# Patient Record
Sex: Female | Born: 1960 | Race: White | Hispanic: No | Smoking: Former smoker
Health system: Southern US, Community
[De-identification: ages and names within clinical notes are randomized; demographics above are authoritative.]

## PROBLEM LIST (undated history)

## (undated) DIAGNOSIS — F419 Anxiety disorder, unspecified: Secondary | ICD-10-CM

## (undated) DIAGNOSIS — F32A Depression, unspecified: Secondary | ICD-10-CM

## (undated) DIAGNOSIS — E039 Hypothyroidism, unspecified: Secondary | ICD-10-CM

## (undated) DIAGNOSIS — E7800 Pure hypercholesterolemia, unspecified: Secondary | ICD-10-CM

## (undated) DIAGNOSIS — M1990 Unspecified osteoarthritis, unspecified site: Secondary | ICD-10-CM

## (undated) DIAGNOSIS — E119 Type 2 diabetes mellitus without complications: Secondary | ICD-10-CM

## (undated) DIAGNOSIS — I10 Essential (primary) hypertension: Secondary | ICD-10-CM

## (undated) DIAGNOSIS — T7840XA Allergy, unspecified, initial encounter: Secondary | ICD-10-CM

## (undated) DIAGNOSIS — F329 Major depressive disorder, single episode, unspecified: Secondary | ICD-10-CM

## (undated) HISTORY — PX: BREAST BIOPSY: SHX20

## (undated) HISTORY — DX: Unspecified osteoarthritis, unspecified site: M19.90

## (undated) HISTORY — PX: OTHER SURGICAL HISTORY: SHX169

## (undated) HISTORY — DX: Allergy, unspecified, initial encounter: T78.40XA

---
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
## 2000-03-24 HISTORY — PX: THYROID LOBECTOMY: SHX420

## 2000-10-21 ENCOUNTER — Other Ambulatory Visit: Admission: RE | Admit: 2000-10-21 | Discharge: 2000-10-21 | Payer: Self-pay | Admitting: Internal Medicine

## 2004-04-30 ENCOUNTER — Ambulatory Visit: Payer: Self-pay

## 2005-07-27 ENCOUNTER — Ambulatory Visit: Payer: Self-pay

## 2006-07-28 ENCOUNTER — Ambulatory Visit: Payer: Self-pay | Admitting: General Surgery

## 2007-03-28 ENCOUNTER — Ambulatory Visit: Payer: Self-pay

## 2007-04-05 ENCOUNTER — Ambulatory Visit: Payer: Self-pay

## 2007-08-16 ENCOUNTER — Ambulatory Visit: Payer: Self-pay | Admitting: Family Medicine

## 2007-10-25 ENCOUNTER — Ambulatory Visit: Payer: Self-pay | Admitting: Gastroenterology

## 2007-10-31 DIAGNOSIS — K52831 Collagenous colitis: Secondary | ICD-10-CM | POA: Insufficient documentation

## 2008-05-24 HISTORY — PX: HAND SURGERY: SHX662

## 2008-08-20 ENCOUNTER — Ambulatory Visit: Payer: Self-pay | Admitting: Family Medicine

## 2009-07-21 ENCOUNTER — Ambulatory Visit: Payer: Self-pay | Admitting: Gastroenterology

## 2009-12-02 ENCOUNTER — Ambulatory Visit: Payer: Self-pay | Admitting: Family Medicine

## 2009-12-11 ENCOUNTER — Ambulatory Visit: Payer: Self-pay | Admitting: Unknown Physician Specialty

## 2009-12-24 ENCOUNTER — Ambulatory Visit: Payer: Self-pay | Admitting: Unknown Physician Specialty

## 2011-03-02 ENCOUNTER — Ambulatory Visit: Payer: Self-pay | Admitting: Family Medicine

## 2011-10-05 LAB — HM COLONOSCOPY

## 2011-12-07 ENCOUNTER — Ambulatory Visit: Payer: Self-pay | Admitting: Unknown Physician Specialty

## 2011-12-23 ENCOUNTER — Ambulatory Visit: Payer: Self-pay | Admitting: Unknown Physician Specialty

## 2012-06-28 ENCOUNTER — Ambulatory Visit: Payer: Self-pay | Admitting: Family Medicine

## 2014-04-26 DIAGNOSIS — E109 Type 1 diabetes mellitus without complications: Secondary | ICD-10-CM | POA: Insufficient documentation

## 2014-04-26 DIAGNOSIS — E039 Hypothyroidism, unspecified: Secondary | ICD-10-CM | POA: Insufficient documentation

## 2014-07-10 ENCOUNTER — Ambulatory Visit: Payer: Self-pay | Admitting: Obstetrics & Gynecology

## 2014-07-10 DIAGNOSIS — I10 Essential (primary) hypertension: Secondary | ICD-10-CM

## 2014-07-10 DIAGNOSIS — Z119 Encounter for screening for infectious and parasitic diseases, unspecified: Secondary | ICD-10-CM

## 2014-07-12 ENCOUNTER — Ambulatory Visit: Payer: Self-pay | Admitting: Obstetrics & Gynecology

## 2014-09-16 LAB — SURGICAL PATHOLOGY

## 2014-09-22 NOTE — Op Note (Signed)
PATIENT NAME:  RE Kristin [name] MR#:  [id number] DATE OF BIRTH:  1960-07-08  DATE OF PROCEDURE:  07/12/2014  PREOPERATIVE DIAGNOSIS: Postmenopausal bleeding.   POSTOPERATIVE DIAGNOSIS:  Postmenopausal bleeding with an endometrial polyp.   PROCEDURE: D and C hysteroscopy and endometrial polypectomy.   SURGEON: Jaydi Bray C. Rayshell Goecke, MD   ANESTHESIA: General.   ESTIMATED BLOOD LOSS: Minimal.   OPERATIVE FLUIDS: Crystalloid 900 mL. No urine output, none attempted.   FINDINGS: Was a large endometrial polyp protruding into the endocervical canal was attachment to the left uterine wall.   COMPLICATIONS: None apparent.   DISPOSITION: Stable to PACU.   INDICATION FOR PROCEDURE: Ms. Kristin [name] had presented with complaints of postmenopausal bleeding. A transvaginal and abdominal ultrasound was performed with evidence of a thickened endometrial stripe, as much as 11 mm on exam. Rather than perform an endometrial biopsy, it was decided to do a D and C hysteroscopy so that the removal of the tissue would be both therapeutic and diagnostic, and informed consent was signed as such.   OPERATIVE NOTE: The patient was brought back to the operating room, where she was identified as Kristin [name] and she placed in the supine position and given general anesthesia. When anesthesia  was adequate, she was placed in the dorsal lithotomy position using Allen stirrups and then prepped and draped in the usual sterile fashion. A surgical timeout was called. A Graves speculum was placed into the vagina, and the cervix was grasped at 12 o'clock with a single-tooth tenaculum and brought forward. The uterus was sounded to approximately 7 cm, and the cervical os was then dilated using Pratt dilators to accommodate the hysteroscope. This was inserted and findings were as above. The hysteroscope was removed and then forceps were then used to try to attempt to remove the polyp. Initially, this was unsuccessful, so curettage  was perform was some tissue returned. Upon reinsertion of the hysteroscope, it was evident that the polyp was not removed. Thus, the hysteroscope was removed and again, forceps were used to try to attempt to remove the polyp. Quite a large volume of tissue was removed with the forceps, and the hysteroscope was then again reinserted but again, there was some more evidence of retained polyp. Another gentle curettage was performed and some more tissue was returned. Once again, the hysteroscope was inserted but again, there was still evidence of retained polyp. At this time, I decided to switch to the MyoSure, which is a hysteroscope that also allows for mechanical removal of the polyp tissue. This was inserted without difficulty and the remainder of the polyp was removed using the MyoSure technique. Following this, there was no evidence of polyp or any retained tissue within the endometrial cavity, so the procedure was then deemed complete. All instruments were removed from the patient, the tenaculum sites were hemostatic, and the patient tolerated the procedure well. The counts were correct x 2, and the patient was brought back to PACU in a stable condition for recovery.    ____________________________ Honor Loh [place] Spees, MD ccw:mw D: 07/12/2014 16:04:49 ET T: 07/12/2014 19:47:42 ET JOB#: [id number]  cc: Vikki Ports C. Annya Lizana, MD, <Dictator> Maceo Pro MD ELECTRONICALLY SIGNED 07/20/2014 19:43

## 2015-01-30 ENCOUNTER — Other Ambulatory Visit: Payer: Self-pay

## 2015-01-30 DIAGNOSIS — F419 Anxiety disorder, unspecified: Secondary | ICD-10-CM | POA: Insufficient documentation

## 2015-01-30 DIAGNOSIS — I10 Essential (primary) hypertension: Secondary | ICD-10-CM

## 2015-01-30 DIAGNOSIS — K648 Other hemorrhoids: Secondary | ICD-10-CM | POA: Insufficient documentation

## 2015-01-30 DIAGNOSIS — D509 Iron deficiency anemia, unspecified: Secondary | ICD-10-CM | POA: Insufficient documentation

## 2015-01-30 DIAGNOSIS — F32A Depression, unspecified: Secondary | ICD-10-CM

## 2015-01-30 DIAGNOSIS — Z6841 Body Mass Index (BMI) 40.0 and over, adult: Secondary | ICD-10-CM | POA: Insufficient documentation

## 2015-01-30 DIAGNOSIS — R0602 Shortness of breath: Secondary | ICD-10-CM | POA: Insufficient documentation

## 2015-01-30 DIAGNOSIS — E041 Nontoxic single thyroid nodule: Secondary | ICD-10-CM | POA: Insufficient documentation

## 2015-01-30 DIAGNOSIS — E559 Vitamin D deficiency, unspecified: Secondary | ICD-10-CM | POA: Insufficient documentation

## 2015-01-30 DIAGNOSIS — E7800 Pure hypercholesterolemia, unspecified: Secondary | ICD-10-CM

## 2015-01-30 DIAGNOSIS — G4730 Sleep apnea, unspecified: Secondary | ICD-10-CM | POA: Insufficient documentation

## 2015-01-30 DIAGNOSIS — F329 Major depressive disorder, single episode, unspecified: Secondary | ICD-10-CM

## 2015-01-30 DIAGNOSIS — E039 Hypothyroidism, unspecified: Secondary | ICD-10-CM | POA: Insufficient documentation

## 2015-01-30 DIAGNOSIS — Z87891 Personal history of nicotine dependence: Secondary | ICD-10-CM | POA: Insufficient documentation

## 2015-01-30 DIAGNOSIS — E119 Type 2 diabetes mellitus without complications: Secondary | ICD-10-CM | POA: Insufficient documentation

## 2015-01-30 DIAGNOSIS — E668 Other obesity: Secondary | ICD-10-CM | POA: Insufficient documentation

## 2015-01-30 DIAGNOSIS — K210 Gastro-esophageal reflux disease with esophagitis: Secondary | ICD-10-CM

## 2015-01-30 MED ORDER — METOPROLOL SUCCINATE ER 25 MG PO TB24: 25.0000 mg | ORAL_TABLET | Freq: Every day | ORAL | Status: DC

## 2015-01-30 MED ORDER — SERTRALINE HCL 100 MG PO TABS: 150.0000 mg | ORAL_TABLET | Freq: Every day | ORAL | Status: DC

## 2015-01-30 MED ORDER — LOSARTAN POTASSIUM 100 MG PO TABS: 100.0000 mg | ORAL_TABLET | Freq: Every day | ORAL | Status: DC

## 2015-01-30 MED ORDER — ATORVASTATIN CALCIUM 40 MG PO TABS: 40.0000 mg | ORAL_TABLET | Freq: Every day | ORAL | Status: DC

## 2015-08-29 DIAGNOSIS — M25542 Pain in joints of left hand: Secondary | ICD-10-CM | POA: Insufficient documentation

## 2015-08-29 DIAGNOSIS — G5620 Lesion of ulnar nerve, unspecified upper limb: Secondary | ICD-10-CM | POA: Insufficient documentation

## 2015-09-22 ENCOUNTER — Inpatient Hospital Stay: Admission: RE | Admit: 2015-09-22 | Payer: BLUE CROSS/BLUE SHIELD | Source: Ambulatory Visit

## 2015-10-17 ENCOUNTER — Ambulatory Visit (INDEPENDENT_AMBULATORY_CARE_PROVIDER_SITE_OTHER): Payer: BLUE CROSS/BLUE SHIELD | Admitting: Physician Assistant

## 2015-10-17 ENCOUNTER — Encounter: Payer: Self-pay | Admitting: Physician Assistant

## 2015-10-17 VITALS — BP 120/68 | HR 82 | Temp 98.9°F | Resp 16 | Ht 68.0 in | Wt 286.6 lb

## 2015-10-17 DIAGNOSIS — E7800 Pure hypercholesterolemia, unspecified: Secondary | ICD-10-CM | POA: Diagnosis not present

## 2015-10-17 DIAGNOSIS — Z124 Encounter for screening for malignant neoplasm of cervix: Secondary | ICD-10-CM

## 2015-10-17 DIAGNOSIS — E559 Vitamin D deficiency, unspecified: Secondary | ICD-10-CM

## 2015-10-17 DIAGNOSIS — Z0000 Encounter for general adult medical examination without abnormal findings: Secondary | ICD-10-CM | POA: Diagnosis not present

## 2015-10-17 DIAGNOSIS — Z1239 Encounter for other screening for malignant neoplasm of breast: Secondary | ICD-10-CM | POA: Diagnosis not present

## 2015-10-17 DIAGNOSIS — F419 Anxiety disorder, unspecified: Secondary | ICD-10-CM

## 2015-10-17 MED ORDER — ALPRAZOLAM 0.5 MG PO TABS: 0.5000 mg | ORAL_TABLET | Freq: Two times a day (BID) | ORAL | Status: DC | PRN

## 2015-10-17 NOTE — Patient Instructions (Signed)

## 2015-10-17 NOTE — Progress Notes (Signed)
Patient: Kristin [name], Female    DOB: [birth year], 55 y.o.   MRN: [id number] Visit Date: 10/17/2015  Today's Provider: Mar Daring, PA-C   Chief Complaint  Patient presents with  . Annual Exam   Subjective:    Annual physical exam Kristin [name] is a 55 y.o. female who presents today for health maintenance and complete physical. She feels well. She reports exercising none. She is concern about her weight and thinks that her problem is not moving much. She reports she is sleeping fairly well.  She is followed by Dr. Gabriel Carina, Endocrinology for her hypothyroidism and T1DM. She has underwent a left lobe thyroidectomy secondary to thyroid cyst, benign.   Colonoscopy: 10/05/2011 Internal Hemorrhoids. Repeat 3-5 years. She also has collagenous colitis. She uses anti-diarrheals for this. Mammogram: 06/28/2012 BI-RADS 2: Benign Pap: 2012- Normal -----------------------------------------------------------------   Review of Systems  Constitutional: Negative.   HENT: Negative.   Eyes: Negative.   Respiratory: Negative.   Cardiovascular: Negative.   Gastrointestinal: Negative.   Endocrine: Negative.   Genitourinary: Negative.   Musculoskeletal: Negative.   Skin: Negative.   Allergic/Immunologic: Negative.   Neurological: Negative.   Hematological: Negative.   Psychiatric/Behavioral: Negative.     Social History      She  reports that she quit smoking about 7 years ago. Her smoking use included Cigarettes. She has a 33 pack-year smoking history. She has never used smokeless tobacco. She reports that she drinks alcohol. She reports that she does not use illicit drugs.       Social History   Social History  . Marital Status: Widowed    Spouse Name: N/A  . Number of Children: 0  . Years of Education: College   Social History Main Topics  . Smoking status: Former Smoker -- 1.00 packs/day for 33 years    Types: Cigarettes    Quit date: 05/24/2008  . Smokeless  tobacco: Never Used  . Alcohol Use: Yes     Comment: Occasional alcohol use  . Drug Use: No  . Sexual Activity: Not Asked   Other Topics Concern  . None   Social History Narrative    History reviewed. No pertinent past medical history.   Patient Active Problem List   Diagnosis Date Noted  . 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 Anxiety 01/30/2015  . Benign essential HTN 01/30/2015  . Clinical depression 01/30/2015  . Diabetes ([place]) 01/30/2015  . Esophagitis, reflux 01/30/2015  . Hypercholesteremia 01/30/2015  . Adult hypothyroidism 01/30/2015  . Hemorrhoids, internal 01/30/2015  . Anemia, iron deficiency 01/30/2015  . Extreme obesity ([place]) 01/30/2015  . Breath shortness 01/30/2015  . Apnea, sleep 01/30/2015  . 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 Avitaminosis D 01/30/2015  . Type 1 diabetes mellitus without complication ([place]) [id number]  . Acquired hypothyroidism 04/26/2014  . CC (collagenous colitis) 10/31/2007    Past Surgical History  Procedure Laterality Date  . Thyroid lobectomy Left 03/2000  . 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 Mother Alive   . Father    . Sister Alive   . Brother Deceased     MVA  . Maternal Grandmother Deceased   . Maternal Grandfather Deceased   . Brother Alive         Her family history includes Alcohol abuse in her brother, maternal grandfather,  mother, and sister; Anxiety disorder in her mother and sister; Bipolar disorder in her sister; Depression in her maternal grandmother; Healthy in her brother; Heart disease in her maternal grandfather; Hypothyroidism in her mother; Stroke in her maternal grandmother.    Allergies  Allergen Reactions  . Asacol  [Mesalamine]     Other reaction(s): Abdominal pain  . Lisinopril Cough    Previous Medications   ALPRAZOLAM (XANAX) 0.5 MG TABLET    Take by mouth.   ATORVASTATIN (LIPITOR)  40 MG TABLET    Take 1 tablet (40 mg total) by mouth at bedtime.   INSULIN ASPART (NOVOLOG FLEXPEN) 100 UNIT/ML FLEXPEN    Inject into the skin.   INSULIN GLARGINE (LANTUS SOLOSTAR) 100 UNIT/ML SOLOSTAR PEN    Inject into the skin.   LEVOTHYROXINE (SYNTHROID, LEVOTHROID) 125 MCG TABLET    Take by mouth.    LOSARTAN (COZAAR) 100 MG TABLET    Take 1 tablet (100 mg total) by mouth daily.   METOPROLOL SUCCINATE (TOPROL-XL) 25 MG 24 HR TABLET    Take 1 tablet (25 mg total) by mouth daily.   SERTRALINE (ZOLOFT) 100 MG TABLET    Take 1.5 tablets (150 mg total) by mouth daily.   VITAMIN D, ERGOCALCIFEROL, (DRISDOL) 50000 UNITS CAPS CAPSULE    Take by mouth.    Patient Care Team: Mar Daring, PA-C as PCP - General (Family Medicine)     Objective:   Vitals: BP 120/68 mmHg  Pulse 82  Temp(Src) 98.9 F (37.2 C) (Oral)  Resp 16  Ht 5\' 8"  (1.727 m)  Wt 286 lb 9.6 oz (130.001 kg)  BMI 43.59 kg/m2   Physical Exam  Constitutional: She is oriented to person, place, and time. She appears well-developed and well-nourished. No distress.  HENT:  Head: Normocephalic and atraumatic.  Right Ear: Hearing, tympanic membrane, external ear and ear canal normal.  Left Ear: Hearing, tympanic membrane, external ear and ear canal normal.  Nose: Nose normal.  Mouth/Throat: Uvula is midline, oropharynx is clear and moist and mucous membranes are normal. No oropharyngeal exudate.  Eyes: Conjunctivae and EOM are normal. Pupils are equal, round, and reactive to light. Right eye exhibits no discharge. Left eye exhibits no discharge. No scleral icterus.  Neck: Normal range of motion. Neck supple. No JVD present. Carotid bruit is not present. No tracheal deviation present. No thyromegaly present.  Cardiovascular: Normal rate, regular rhythm, normal heart sounds and intact distal pulses.  Exam reveals no gallop and no friction rub.   No murmur heard. Pulmonary/Chest: Effort normal and breath sounds normal. No  respiratory distress. She has no wheezes. She has no rales. She exhibits no tenderness. Right breast exhibits no inverted nipple, no mass, no nipple discharge, no skin change and no tenderness. Left breast exhibits no inverted nipple, no mass, no nipple discharge, no skin change and no tenderness. Breasts are symmetrical.  Abdominal: Soft. Bowel sounds are normal. She exhibits no distension and no mass. There is no tenderness. There is no rebound and no guarding. Hernia confirmed negative in the right inguinal area and confirmed negative in the left inguinal area.  Genitourinary: Rectum normal, vagina normal and uterus normal. No breast swelling, tenderness, discharge or bleeding. Pelvic exam was performed with patient supine. There is no rash, tenderness, lesion or injury on the right labia. There is no rash, tenderness, lesion or injury on the left labia. Cervix exhibits no motion tenderness, no discharge and no friability. Right adnexum displays no mass, no  tenderness and no fullness. Left adnexum displays no mass, no tenderness and no fullness. No erythema, tenderness or bleeding in the vagina. No signs of injury around the vagina. No vaginal discharge found.  Musculoskeletal: Normal range of motion. She exhibits no edema or tenderness.  Lymphadenopathy:    She has no cervical adenopathy.       Right: No inguinal adenopathy present.       Left: No inguinal adenopathy present.  Neurological: She is alert and oriented to person, place, and time. She has normal reflexes. No cranial nerve deficit. Coordination normal.  Skin: Skin is warm and dry. No rash noted. She is not diaphoretic.  Psychiatric: She has a normal mood and affect. Her behavior is normal. Judgment and thought content normal.  Vitals reviewed.    Depression Screen No flowsheet data found.    Assessment & Plan:     Routine Health Maintenance and Physical Exam  Exercise Activities and Dietary recommendations Goals    None        Immunization History  Administered Date(s) Administered  . Pneumococcal Polysaccharide-23 04/17/2013  . Tdap 01/14/2011  . Zoster 05/15/2013    Health Maintenance  Topic Date Due  . HEMOGLOBIN A1C  August 25, 1960  . Hepatitis C Screening  [birth year]  . FOOT EXAM  10/19/1968  . OPHTHALMOLOGY EXAM  10/19/1968  . HIV Screening  10/19/1973  . PAP SMEAR  10/20/1979  . MAMMOGRAM  10/19/2008  . COLONOSCOPY  10/19/2008  . INFLUENZA VACCINE  12/23/2015  . PNEUMOCOCCAL POLYSACCHARIDE VACCINE (2) 04/17/2018  . TETANUS/TDAP  01/13/2021      Discussed health benefits of physical activity, and encouraged her to engage in regular exercise appropriate for her age and condition.   1. Annual physical exam Normal physical exam today. Will check labs as below and f/u pending lab results. If labs are stable and WNL she will not need to have these rechecked for one year at her next annual physical exam. She is to call the office in the meantime if she has any acute issue, questions or concerns. - CBC with Differential/Platelet - Comprehensive metabolic panel - Hemoglobin A1c  2. Breast cancer screening Breast exam today was normal. There is no family history of breast cancer. She does perform regular self breast exams. Mammogram was ordered as below. Information for [facility] was given to patient so she may schedule her mammogram at her convenience. - MM DIGITAL SCREENING BILATERAL; Future  3. Cervical cancer screening Pap collected today. Will send as below and f/u pending results. - Pap IG w/ reflex to HPV when ASC-U  4. Avitaminosis D Is currently taking 4000 international units daily over-the-counter. We'll check labs as below and follow-up pending lab results. - Vitamin D (25 hydroxy)  5. Hypercholesteremia Stable on atorvastatin 40 mg. I will check cholesterol panel as below and follow-up pending results. - Lipid panel  6. Acute anxiety Stable. Diagnosis pulled for  medication refill. Continue current medical treatment plan. - ALPRAZolam (XANAX) 0.5 MG tablet; Take 1 tablet (0.5 mg total) by mouth 2 (two) times daily as needed for anxiety.  Dispense: 60 tablet; Refill: 1  --------------------------------------------------------------------

## 2015-10-23 ENCOUNTER — Encounter
Admission: RE | Admit: 2015-10-23 | Discharge: 2015-10-23 | Disposition: A | Discharge status: Home / Self Care | Payer: BLUE CROSS/BLUE SHIELD | Source: Ambulatory Visit | Attending: Orthopedic Surgery | Admitting: Orthopedic Surgery

## 2015-10-23 DIAGNOSIS — Z01812 Encounter for preprocedural laboratory examination: Secondary | ICD-10-CM | POA: Diagnosis present

## 2015-10-23 DIAGNOSIS — Z01810 Encounter for preprocedural cardiovascular examination: Secondary | ICD-10-CM | POA: Diagnosis not present

## 2015-10-23 HISTORY — DX: Depression, unspecified: F32.A

## 2015-10-23 HISTORY — DX: Essential (primary) hypertension: I10

## 2015-10-23 HISTORY — DX: Anxiety disorder, unspecified: F41.9

## 2015-10-23 HISTORY — DX: Pure hypercholesterolemia, unspecified: E78.00

## 2015-10-23 HISTORY — DX: Hypothyroidism, unspecified: E03.9

## 2015-10-23 HISTORY — DX: Major depressive disorder, single episode, unspecified: F32.9

## 2015-10-23 LAB — PAP IG W/ RFLX HPV ASCU: PAP Smear Comment: 0

## 2015-10-23 LAB — BASIC METABOLIC PANEL
Anion gap: 6 (ref 5–15)
BUN: 14 mg/dL (ref 6–20)
CO2: 28 mmol/L (ref 22–32)
CREATININE: 0.7 mg/dL (ref 0.44–1.00)
Calcium: 8.9 mg/dL (ref 8.9–10.3)
Chloride: 103 mmol/L (ref 101–111)
GFR calc Af Amer: >60 mL/min (ref >60)
GLUCOSE: 192 mg/dL — AB (ref 65–99)
Potassium: 4.2 mmol/L (ref 3.5–5.1)
SODIUM: 137 mmol/L (ref 135–145)

## 2015-10-23 NOTE — Patient Instructions (Signed)
  Your procedure is scheduled on: 11/06/15 Thurs Report to Same Day Surgery 2nd floor medical mall To find out your arrival time please call [phone] between 1PM - 3PM on 11/05/15 Wed  Remember: Instructions that are not followed completely may result in serious medical risk, up to and including death, or upon the discretion of your surgeon and anesthesiologist your surgery may need to be rescheduled.    _x___ 1. Do not eat food or drink liquids after midnight. No gum chewing or hard candies.     __x__ 2. No Alcohol for 24 hours before or after surgery.   ____ 3. Bring all medications with you on the day of surgery if instructed.    __x__ 4. Notify your doctor if there is any change in your medical condition     (cold, fever, infections).     Do not wear jewelry, make-up, hairpins, clips or nail polish.  Do not wear lotions, powders, or perfumes. You may wear deodorant.  Do not shave 48 hours prior to surgery. Men may shave face and neck.  Do not bring valuables to the hospital.    [facility] is not responsible for any belongings or valuables.               Contacts, dentures or bridgework may not be worn into surgery.  Leave your suitcase in the car. After surgery it may be brought to your room.  For patients admitted to the hospital, discharge time is determined by your treatment team.   Patients discharged the day of surgery will not be allowed to drive home.    Please read over the following fact sheets that you were given:   [facility] Preparing for Surgery and or MRSA Information   _x___ Take these medicines the morning of surgery with A SIP OF WATER:    1. levothyroxine (SYNTHROID, LEVOTHROID) 200 MCG tablet  2.losartan (COZAAR) 100 MG tablet  3.metoprolol succinate (TOPROL-XL) 25 MG 24 hr tablet  4.sertraline (ZOLOFT) 100 MG tablet  5.  6.  ____ Fleet Enema (as directed)   _x___ Use CHG Soap or sage wipes as directed on instruction sheet   ____ Use inhalers on  the day of surgery and bring to hospital day of surgery  ____ Stop metformin 2 days prior to surgery    _x___ Take 1/2 of usual insulin dose the night before surgery and none on the morning of           surgery.   __x__ Stop aspirin or coumadin, or plavix Stop Aspirin 1 week before surgery  _x__ Stop Anti-inflammatories such as Advil, Aleve, Ibuprofen, Motrin, Naproxen,          Naprosyn, Goodies powders or aspirin products. Ok to take Tylenol.   ____ Stop supplements until after surgery.    ____ Bring C-Pap to the hospital.

## 2015-10-24 LAB — COMPREHENSIVE METABOLIC PANEL
A/G RATIO: 1.8 (ref 1.2–2.2)
ALT: 17 IU/L (ref 0–32)
AST: 12 IU/L (ref 0–40)
Albumin: 4.1 g/dL (ref 3.5–5.5)
Alkaline Phosphatase: 83 IU/L (ref 39–117)
BUN/Creatinine Ratio: 15 (ref 9–23)
BUN: 11 mg/dL (ref 6–24)
Bilirubin Total: 0.4 mg/dL (ref 0.0–1.2)
CALCIUM: 9.3 mg/dL (ref 8.7–10.2)
CO2: 26 mmol/L (ref 18–29)
Chloride: 98 mmol/L (ref 96–106)
Creatinine, Ser: 0.72 mg/dL (ref 0.57–1.00)
GFR, EST AFRICAN AMERICAN: 108 mL/min/{1.73_m2} (ref >59)
GFR, EST NON AFRICAN AMERICAN: 93 mL/min/{1.73_m2} (ref >59)
Globulin, Total: 2.3 g/dL (ref 1.5–4.5)
Glucose: 215 mg/dL — ABNORMAL HIGH (ref 65–99)
POTASSIUM: 5.2 mmol/L (ref 3.5–5.2)
Sodium: 139 mmol/L (ref 134–144)
TOTAL PROTEIN: 6.4 g/dL (ref 6.0–8.5)

## 2015-10-24 LAB — LIPID PANEL
CHOL/HDL RATIO: 2.8 ratio (ref 0.0–4.4)
Cholesterol, Total: 166 mg/dL (ref 100–199)
HDL: 60 mg/dL (ref >39)
LDL Calculated: 80 mg/dL (ref 0–99)
Triglycerides: 131 mg/dL (ref 0–149)
VLDL CHOLESTEROL CAL: 26 mg/dL (ref 5–40)

## 2015-10-24 LAB — VITAMIN D 25 HYDROXY (VIT D DEFICIENCY, FRACTURES): VIT D 25 HYDROXY: 39.9 ng/mL (ref 30.0–100.0)

## 2015-10-24 LAB — CBC WITH DIFFERENTIAL/PLATELET
BASOS: 0 %
Basophils Absolute: 0 10*3/uL (ref 0.0–0.2)
EOS (ABSOLUTE): 0.1 10*3/uL (ref 0.0–0.4)
Eos: 1 %
HEMOGLOBIN: 13.6 g/dL (ref 11.1–15.9)
Hematocrit: 40.3 % (ref 34.0–46.6)
IMMATURE GRANS (ABS): 0 10*3/uL (ref 0.0–0.1)
IMMATURE GRANULOCYTES: 0 %
LYMPHS ABS: 2 10*3/uL (ref 0.7–3.1)
LYMPHS: 28 %
MCH: 30.8 pg (ref 26.6–33.0)
MCHC: 33.7 g/dL (ref 31.5–35.7)
MCV: 91 fL (ref 79–97)
MONOS ABS: 0.5 10*3/uL (ref 0.1–0.9)
Monocytes: 7 %
NEUTROS ABS: 4.7 10*3/uL (ref 1.4–7.0)
Neutrophils: 64 %
PLATELETS: 248 10*3/uL (ref 150–379)
RBC: 4.42 x10E6/uL (ref 3.77–5.28)
RDW: 13.3 % (ref 12.3–15.4)
WBC: 7.3 10*3/uL (ref 3.4–10.8)

## 2015-11-06 ENCOUNTER — Encounter: Payer: Self-pay | Admitting: *Deleted

## 2015-11-06 ENCOUNTER — Encounter
Admission: RE | Disposition: A | Discharge status: Home / Self Care | Payer: Self-pay | Source: Ambulatory Visit | Attending: Orthopedic Surgery

## 2015-11-06 ENCOUNTER — Ambulatory Visit: Payer: BLUE CROSS/BLUE SHIELD | Admitting: Anesthesiology

## 2015-11-06 ENCOUNTER — Ambulatory Visit
Admission: RE | Admit: 2015-11-06 | Discharge: 2015-11-06 | Disposition: A | Discharge status: Home / Self Care | Payer: BLUE CROSS/BLUE SHIELD | Source: Ambulatory Visit | Attending: Orthopedic Surgery | Admitting: Orthopedic Surgery

## 2015-11-06 DIAGNOSIS — G5602 Carpal tunnel syndrome, left upper limb: Secondary | ICD-10-CM | POA: Insufficient documentation

## 2015-11-06 DIAGNOSIS — Z794 Long term (current) use of insulin: Secondary | ICD-10-CM | POA: Diagnosis not present

## 2015-11-06 DIAGNOSIS — Z87891 Personal history of nicotine dependence: Secondary | ICD-10-CM | POA: Diagnosis not present

## 2015-11-06 DIAGNOSIS — F329 Major depressive disorder, single episode, unspecified: Secondary | ICD-10-CM | POA: Diagnosis not present

## 2015-11-06 DIAGNOSIS — E119 Type 2 diabetes mellitus without complications: Secondary | ICD-10-CM | POA: Insufficient documentation

## 2015-11-06 DIAGNOSIS — G4730 Sleep apnea, unspecified: Secondary | ICD-10-CM | POA: Insufficient documentation

## 2015-11-06 DIAGNOSIS — I10 Essential (primary) hypertension: Secondary | ICD-10-CM | POA: Insufficient documentation

## 2015-11-06 DIAGNOSIS — F419 Anxiety disorder, unspecified: Secondary | ICD-10-CM | POA: Insufficient documentation

## 2015-11-06 DIAGNOSIS — Z862 Personal history of diseases of the blood and blood-forming organs and certain disorders involving the immune mechanism: Secondary | ICD-10-CM | POA: Diagnosis not present

## 2015-11-06 DIAGNOSIS — G5622 Lesion of ulnar nerve, left upper limb: Secondary | ICD-10-CM | POA: Insufficient documentation

## 2015-11-06 HISTORY — DX: Type 2 diabetes mellitus without complications: E11.9

## 2015-11-06 HISTORY — PX: CARPAL TUNNEL RELEASE: SHX101

## 2015-11-06 LAB — GLUCOSE, CAPILLARY
GLUCOSE-CAPILLARY: 253 mg/dL — AB (ref 65–99)
Glucose-Capillary: 275 mg/dL — ABNORMAL HIGH (ref 65–99)

## 2015-11-06 SURGERY — CARPAL TUNNEL RELEASE
Anesthesia: General | Laterality: Left | Wound class: Clean

## 2015-11-06 MED ORDER — FENTANYL CITRATE (PF) 100 MCG/2ML IJ SOLN
INTRAMUSCULAR | Status: AC
  Administered 2015-11-06: 25 ug via INTRAVENOUS
  Filled 2015-11-06: qty 2

## 2015-11-06 MED ORDER — HYDROCODONE-ACETAMINOPHEN 5-325 MG PO TABS
ORAL_TABLET | ORAL | Status: AC
  Administered 2015-11-06: 1
  Filled 2015-11-06: qty 1

## 2015-11-06 MED ORDER — CEFAZOLIN SODIUM-DEXTROSE 2-3 GM-% IV SOLR
INTRAVENOUS | Status: DC | PRN
  Administered 2015-11-06: 2 g via INTRAVENOUS

## 2015-11-06 MED ORDER — ONDANSETRON HCL 4 MG/2ML IJ SOLN
INTRAMUSCULAR | Status: DC | PRN
  Administered 2015-11-06: 4 mg via INTRAVENOUS

## 2015-11-06 MED ORDER — FENTANYL CITRATE (PF) 100 MCG/2ML IJ SOLN
25.0000 ug | INTRAMUSCULAR | Status: DC | PRN
  Administered 2015-11-06 (×4): 25 ug via INTRAVENOUS

## 2015-11-06 MED ORDER — INSULIN ASPART 100 UNIT/ML ~~LOC~~ SOLN
SUBCUTANEOUS | Status: AC
  Administered 2015-11-06: 6 [IU]
  Filled 2015-11-06: qty 6

## 2015-11-06 MED ORDER — INSULIN REGULAR HUMAN 100 UNIT/ML IJ SOLN: 6.0000 [IU] | Freq: Once | INTRAMUSCULAR | Status: AC

## 2015-11-06 MED ORDER — CEFAZOLIN SODIUM-DEXTROSE 2-4 GM/100ML-% IV SOLN
2.0000 g | Freq: Once | INTRAVENOUS | Status: AC
  Administered 2015-11-06: 2 g via INTRAVENOUS

## 2015-11-06 MED ORDER — CEFAZOLIN SODIUM-DEXTROSE 2-4 GM/100ML-% IV SOLN
INTRAVENOUS | Status: AC
  Administered 2015-11-06: 2 g via INTRAVENOUS
  Filled 2015-11-06: qty 100

## 2015-11-06 MED ORDER — ONDANSETRON HCL 4 MG/2ML IJ SOLN: 4.0000 mg | Freq: Once | INTRAMUSCULAR | Status: DC | PRN

## 2015-11-06 MED ORDER — MIDAZOLAM HCL 2 MG/2ML IJ SOLN
INTRAMUSCULAR | Status: DC | PRN
  Administered 2015-11-06: 2 mg via INTRAVENOUS

## 2015-11-06 MED ORDER — LIDOCAINE HCL (CARDIAC) 20 MG/ML IV SOLN
INTRAVENOUS | Status: DC | PRN
  Administered 2015-11-06: 60 mg via INTRAVENOUS

## 2015-11-06 MED ORDER — LACTATED RINGERS IV SOLN
INTRAVENOUS | Status: DC | PRN
  Administered 2015-11-06: 07:00:00 via INTRAVENOUS

## 2015-11-06 MED ORDER — SODIUM CHLORIDE 0.9 % IV SOLN
INTRAVENOUS | Status: DC
  Administered 2015-11-06: 07:00:00 via INTRAVENOUS

## 2015-11-06 MED ORDER — PROPOFOL 10 MG/ML IV BOLUS
INTRAVENOUS | Status: DC | PRN
  Administered 2015-11-06: 160 mg via INTRAVENOUS

## 2015-11-06 MED ORDER — FAMOTIDINE 20 MG PO TABS
ORAL_TABLET | ORAL | Status: AC
  Administered 2015-11-06: 20 mg via ORAL
  Filled 2015-11-06: qty 1

## 2015-11-06 MED ORDER — BUPIVACAINE HCL (PF) 0.5 % IJ SOLN
INTRAMUSCULAR | Status: AC
  Filled 2015-11-06: qty 30

## 2015-11-06 MED ORDER — BUPIVACAINE HCL (PF) 0.5 % IJ SOLN
INTRAMUSCULAR | Status: DC | PRN
  Administered 2015-11-06: 10 mL

## 2015-11-06 MED ORDER — HYDROCODONE-ACETAMINOPHEN 5-325 MG PO TABS: 1.0000 | ORAL_TABLET | Freq: Four times a day (QID) | ORAL | Status: DC | PRN

## 2015-11-06 MED ORDER — FAMOTIDINE 20 MG PO TABS
20.0000 mg | ORAL_TABLET | Freq: Once | ORAL | Status: AC
  Administered 2015-11-06: 20 mg via ORAL

## 2015-11-06 MED ORDER — FENTANYL CITRATE (PF) 100 MCG/2ML IJ SOLN
INTRAMUSCULAR | Status: DC | PRN
  Administered 2015-11-06 (×3): 25 ug via INTRAVENOUS

## 2015-11-06 SURGICAL SUPPLY — 28 items
BANDAGE ACE 3X5.8 VEL STRL LF (GAUZE/BANDAGES/DRESSINGS) ×3 IMPLANT
BNDG ESMARK 4X12 TAN STRL LF (GAUZE/BANDAGES/DRESSINGS) ×3 IMPLANT
CANISTER SUCT 1200ML W/VALVE (MISCELLANEOUS) ×3 IMPLANT
CHLORAPREP W/TINT 26ML (MISCELLANEOUS) ×3 IMPLANT
CUFF TOURN 18 STER (MISCELLANEOUS) IMPLANT
ELECT CAUTERY NDL 2.0 MIC (NEEDLE) ×1 IMPLANT
ELECT CAUTERY NEEDLE 2.0 MIC (NEEDLE) ×3 IMPLANT
ELECT CAUTERY NEEDLE TIP 1.0 (MISCELLANEOUS) ×3
ELECTRODE CAUTERY NEDL TIP 1.0 (MISCELLANEOUS) ×1 IMPLANT
GAUZE PETRO XEROFOAM 1X8 (MISCELLANEOUS) ×3 IMPLANT
GAUZE SPONGE 4X4 12PLY STRL (GAUZE/BANDAGES/DRESSINGS) ×3 IMPLANT
GLOVE BIOGEL PI IND STRL 9 (GLOVE) ×1 IMPLANT
GLOVE BIOGEL PI INDICATOR 9 (GLOVE) ×2
GLOVE SURG ORTHO 9.0 STRL STRW (GLOVE) ×3 IMPLANT
GOWN STRL REUS W/ TWL LRG LVL3 (GOWN DISPOSABLE) ×1 IMPLANT
GOWN STRL REUS W/TWL 2XL LVL3 (GOWN DISPOSABLE) ×3 IMPLANT
GOWN STRL REUS W/TWL LRG LVL3 (GOWN DISPOSABLE) ×3
GOWN SURG XXL (GOWNS) ×3 IMPLANT
KIT RM TURNOVER STRD PROC AR (KITS) ×3 IMPLANT
NS IRRIG 500ML POUR BTL (IV SOLUTION) ×3 IMPLANT
PACK EXTREMITY ARMC (MISCELLANEOUS) ×3 IMPLANT
PAD CAST CTTN 4X4 STRL (SOFTGOODS) ×1 IMPLANT
PADDING CAST COTTON 4X4 STRL (SOFTGOODS) ×3
STOCKINETTE 48X4 2 PLY STRL (GAUZE/BANDAGES/DRESSINGS) ×1 IMPLANT
STOCKINETTE STRL 4IN 9604848 (GAUZE/BANDAGES/DRESSINGS) ×3 IMPLANT
SUT ETHILON 4-0 (SUTURE) ×3
SUT ETHILON 4-0 FS2 18XMFL BLK (SUTURE) ×1
SUTURE ETHLN 4-0 FS2 18XMF BLK (SUTURE) ×1 IMPLANT

## 2015-11-06 NOTE — Op Note (Signed)
11/06/2015  8:02 AM  PATIENT:  Kristin [name]  55 y.o. 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 After patient identification timeout procedure were completed tourniquet was elevated to her 50 murmurs mercury. Incision was made in line with ring metacarpal and subcutaneous tissue spread. The Guyon's canal was identified and opened proximally and released distally there is a large gush of fluid and appeared to have been ganglion cyst at the proximal extent of Guyon's canal that appeared to be ruptured after opening up the proximal end of the canal with a hemostat to protect the underlying structure which appeared to be causing compression with a very thick viscous fluid present. After release there appeared to be no compression apparent. Next going more radially the carpal tunnel was released with mild flexor tenosynovitis noted after release there is good vascular blush to the nerve. The wound was irrigated and then closed with simple interrupted 5-0 nylon skin suture after infiltration of 10 cc half percent Sensorcaine without epinephrine to aid in postop analgesia. Xeroform 4 x 4 web roll and Ace wrap applied  PLAN OF CARE: Discharge to home after PACU  PATIENT DISPOSITION:  PACU -  hemodynamically stable.

## 2015-11-06 NOTE — Anesthesia Postprocedure Evaluation (Signed)
Anesthesia Post Note  Patient: Kristin [name]  Procedure(s) Performed: Procedure(s) (LRB): CARPAL TUNNEL RELEASE (Left) GUYON'S CANAL RELEASE (Left)  Patient location during evaluation: PACU Anesthesia Type: General Level of consciousness: awake and alert Pain management: pain level controlled Vital Signs Assessment: post-procedure vital signs reviewed and stable Respiratory status: spontaneous breathing and respiratory function stable Cardiovascular status: stable Anesthetic complications: no    Last Vitals:  Filed Vitals:   11/06/15 0802 11/06/15 0815  BP: 141/76 132/76  Pulse: 73 66  Temp: 36.4 C   Resp: 9 11    Last Pain:  Filed Vitals:   11/06/15 0822  PainSc: 4                  Mariella Blackwelder K

## 2015-11-06 NOTE — Transfer of Care (Signed)
Immediate Anesthesia Transfer of Care Note  Patient: Kristin [name]  Procedure(s) Performed: Procedure(s): CARPAL TUNNEL RELEASE (Left) GUYON'S CANAL RELEASE (Left)  Patient Location: PACU  Anesthesia Type:General  Level of Consciousness: awake, alert  and oriented  Airway & Oxygen Therapy: Patient Spontanous Breathing  Post-op Assessment: Report given to RN  Post vital signs: Reviewed and stable  Last Vitals:  Filed Vitals:   11/06/15 0621 11/06/15 0802  BP: 141/75 141/76  Pulse: 80 73  Temp: 36.7 C 37 C  Resp: 16 9    Last Pain: There were no vitals filed for this visit.    Patients Stated Pain Goal: 0 ([id number])  Complications: No apparent anesthesia complications

## 2015-11-06 NOTE — Anesthesia Procedure Notes (Signed)
Procedure Name: LMA Insertion Date/Time: 11/06/2015 7:26 AM Performed by: [id number], Dyllan Hughett Pre-anesthesia Checklist: Timeout performed, Patient being monitored, Suction available, Emergency Drugs available and Patient identified Patient Re-evaluated:Patient Re-evaluated prior to inductionOxygen Delivery Method: Circle system utilized Preoxygenation: Pre-oxygenation with 100% oxygen Intubation Type: IV induction LMA Size: 4.0 Number of attempts: 1 Placement Confirmation: CO2 detector,  positive ETCO2 and breath sounds checked- equal and bilateral Tube secured with: Tape

## 2015-11-06 NOTE — H&P (Signed)
Reviewed paper H+P, will be scanned into chart. No changes noted.  

## 2015-11-06 NOTE — Anesthesia Preprocedure Evaluation (Signed)
Anesthesia Evaluation  Patient identified by MRN, date of birth, ID band Patient awake    Reviewed: Allergy & Precautions, NPO status , Patient's Chart, lab work & pertinent test results  History of Anesthesia Complications Negative for: history of anesthetic complications  Airway Mallampati: III       Dental   Pulmonary neg pulmonary ROS, sleep apnea , former smoker,           Cardiovascular hypertension, Pt. on medications and Pt. on home beta blockers + Valvular Problems/Murmurs (s/p repair of valve at 55 yo)      Neuro/Psych Anxiety Depression negative neurological ROS     GI/Hepatic negative GI ROS, Neg liver ROS,   Endo/Other  diabetes, Type 2, Insulin DependentHypothyroidism   Renal/GU negative Renal ROS     Musculoskeletal   Abdominal   Peds  Hematology  (+) anemia ,   Anesthesia Other Findings   Reproductive/Obstetrics                             Anesthesia Physical Anesthesia Plan  ASA: III  Anesthesia Plan: General   Post-op Pain Management:    Induction: Intravenous  Airway Management Planned: LMA  Additional Equipment:   Intra-op Plan:   Post-operative Plan:   Informed Consent: I have reviewed the patients History and Physical, chart, labs and discussed the procedure including the risks, benefits and alternatives for the proposed anesthesia with the patient or authorized representative who has indicated his/her understanding and acceptance.     Plan Discussed with:   Anesthesia Plan Comments:         Anesthesia Quick Evaluation

## 2015-11-06 NOTE — Discharge Instructions (Signed)
Loosen Ace wrap if fingers swell. Keep arm elevated. Work fingers is much as possible. Pain medicine as directed   AMBULATORY SURGERY  DISCHARGE INSTRUCTIONS   1) The drugs that you were given will stay in your system until tomorrow so for the next 24 hours you should not:  A) Drive an automobile B) Make any legal decisions C) Drink any alcoholic beverage   2) You may resume regular meals tomorrow.  Today it is better to start with liquids and gradually work up to solid foods.  You may eat anything you prefer, but it is better to start with liquids, then soup and crackers, and gradually work up to solid foods.   3) Please notify your doctor immediately if you have any unusual bleeding, trouble breathing, redness and pain at the surgery site, drainage, fever, or pain not relieved by medication.    4) Additional Instructions:        Please contact your physician with any problems or Same Day Surgery at [phone], Monday through Friday 6 am to 4 pm, or [place] at [facility] number at [phone].

## 2015-11-10 ENCOUNTER — Other Ambulatory Visit: Payer: Self-pay | Admitting: Physician Assistant

## 2015-11-10 ENCOUNTER — Ambulatory Visit
Admission: RE | Admit: 2015-11-10 | Discharge: 2015-11-10 | Disposition: A | Discharge status: Home / Self Care | Payer: BLUE CROSS/BLUE SHIELD | Source: Ambulatory Visit | Attending: Physician Assistant | Admitting: Physician Assistant

## 2015-11-10 DIAGNOSIS — Z1231 Encounter for screening mammogram for malignant neoplasm of breast: Secondary | ICD-10-CM | POA: Diagnosis not present

## 2015-11-10 DIAGNOSIS — Z1239 Encounter for other screening for malignant neoplasm of breast: Secondary | ICD-10-CM

## 2015-12-29 ENCOUNTER — Other Ambulatory Visit: Payer: Self-pay | Admitting: Family Medicine

## 2015-12-29 DIAGNOSIS — F329 Major depressive disorder, single episode, unspecified: Secondary | ICD-10-CM

## 2015-12-29 DIAGNOSIS — E7800 Pure hypercholesterolemia, unspecified: Secondary | ICD-10-CM

## 2015-12-29 DIAGNOSIS — F32A Depression, unspecified: Secondary | ICD-10-CM

## 2015-12-29 DIAGNOSIS — I10 Essential (primary) hypertension: Secondary | ICD-10-CM

## 2015-12-29 NOTE — Telephone Encounter (Signed)
Last OV/CPE with you was 10/18/2014. Has CPE scheduled for next year. Renaldo Fiddler, CMA

## 2015-12-31 ENCOUNTER — Other Ambulatory Visit: Payer: Self-pay

## 2015-12-31 DIAGNOSIS — F329 Major depressive disorder, single episode, unspecified: Secondary | ICD-10-CM

## 2015-12-31 DIAGNOSIS — I10 Essential (primary) hypertension: Secondary | ICD-10-CM

## 2015-12-31 DIAGNOSIS — F32A Depression, unspecified: Secondary | ICD-10-CM

## 2015-12-31 DIAGNOSIS — E7800 Pure hypercholesterolemia, unspecified: Secondary | ICD-10-CM

## 2015-12-31 MED ORDER — SERTRALINE HCL 100 MG PO TABS: 150.0000 mg | ORAL_TABLET | Freq: Every day | ORAL | 1 refills | Status: DC

## 2015-12-31 MED ORDER — ATORVASTATIN CALCIUM 40 MG PO TABS: 40.0000 mg | ORAL_TABLET | Freq: Every day | ORAL | 1 refills | Status: DC

## 2015-12-31 MED ORDER — METOPROLOL SUCCINATE ER 25 MG PO TB24: 25.0000 mg | ORAL_TABLET | Freq: Every day | ORAL | 1 refills | Status: DC

## 2015-12-31 MED ORDER — LOSARTAN POTASSIUM 100 MG PO TABS: 100.0000 mg | ORAL_TABLET | Freq: Every day | ORAL | 1 refills | Status: DC

## 2016-01-09 ENCOUNTER — Ambulatory Visit: Payer: BLUE CROSS/BLUE SHIELD | Admitting: Occupational Therapy

## 2016-01-12 ENCOUNTER — Encounter: Payer: Self-pay | Admitting: Occupational Therapy

## 2016-01-12 ENCOUNTER — Ambulatory Visit: Payer: BLUE CROSS/BLUE SHIELD | Attending: Orthopedic Surgery | Admitting: Occupational Therapy

## 2016-01-12 DIAGNOSIS — M25642 Stiffness of left hand, not elsewhere classified: Secondary | ICD-10-CM | POA: Diagnosis present

## 2016-01-12 DIAGNOSIS — M6281 Muscle weakness (generalized): Secondary | ICD-10-CM | POA: Diagnosis present

## 2016-01-12 DIAGNOSIS — M79642 Pain in left hand: Secondary | ICD-10-CM | POA: Insufficient documentation

## 2016-01-12 DIAGNOSIS — R208 Other disturbances of skin sensation: Secondary | ICD-10-CM | POA: Diagnosis present

## 2016-01-12 NOTE — Therapy (Signed)
[place] [facility] [address], [place], [address] Phone: [phone]   Fax:  [phone]  Occupational Therapy Treatment  Patient Details  Name: Kristin [name] MRN: [id number] Date of Birth: 19-Aug-1960 Referring Provider: Rudene Christians  Encounter Date: 01/12/2016      OT End of Session - 01/12/16 1052    Visit Number 1   Number of Visits 8   Date for OT Re-Evaluation 02/09/16   OT Start Time 0930   OT Stop Time 1027   OT Time Calculation (min) 57 min   Activity Tolerance Patient tolerated treatment well   Behavior During Therapy [facility] for tasks assessed/performed      Past Medical History:  Diagnosis Date  . Anxiety   . Depression   . Diabetes mellitus without complication ([place])    type 1  . Elevated cholesterol   . Hypertension   . Hypothyroidism     Past Surgical History:  Procedure Laterality Date  . BREAST BIOPSY Left    bx/clip-neg  . 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 CARPAL TUNNEL RELEASE Left 11/06/2015   Procedure: CARPAL TUNNEL RELEASE;  Surgeon: Hessie Knows, MD;  Location: [facility] ORS;  Service: Orthopedics;  Laterality: Left;  . ctr Right   . HAND SURGERY Right 2010   carpal tunnel  . THYROID LOBECTOMY Left 03/2000    There were no vitals filed for this visit.      Subjective Assessment - 01/12/16 0932    Subjective  Befor surgery weakness , some numbness , pinkie out to the side - since surgery weakness, some numbness coming nad going, catching and pain at thumb - Dr Rudene Christians 28 July and referred and surgery 6/15   Patient Stated Goals want more dexterity , strength, in L hand - and thumb not to trigger -    Currently in Pain? No/denies            [facility] OT Assessment - 01/12/16 0001      Assessment   Diagnosis L CTR and guyons canal    Referring Provider Rudene Christians   Onset Date 11/06/15     Home  Environment   Lives With Alone     Prior Function    Vocation Full time employment   Leisure Reading , Movies - on computer -own house weork      Strength   Right Hand Grip (lbs) 74   Right Hand Lateral Pinch 24 lbs   Right Hand 3 Point Pinch 24 lbs   Left Hand Grip (lbs) 40   Left Hand Lateral Pinch 6 lbs   Left Hand 3 Point Pinch 6 lbs     Right Hand AROM   R Thumb MCP 0-60 50 Degrees   R Thumb IP 0-80 65 Degrees   R Thumb Opposition to Index --  WNL     Left Hand AROM   L Thumb MCP 0-60 45 Degrees   L Thumb IP 0-80 75 Degrees   L Thumb Opposition to Index --  opposition to 4th       Contrast done and HEP ed on  - hand out provided                     OT Education - 01/12/16 1052    Education provided Yes   Education Details findings , POC , HEP    Person(s) Educated Patient   Methods  Explanation;Demonstration;Tactile cues;Verbal cues;Handout   Comprehension Verbal cues required;Returned demonstration;Verbalized understanding          OT Short Term Goals - 01/12/16 1548      OT SHORT TERM GOAL #1   Title Pain on PRWHE improve by at least 10 points    Baseline at eval 17/50 on PRWHE    Time 3   Period Weeks   Status New     OT SHORT TERM GOAL #2   Title Triggering on L thumb decrease to 1 x day to use in ADL's   Baseline triggering  several times during session - tenderness over A1pulley    Time 3   Period Weeks   Status New     OT SHORT TERM GOAL #3   Title pt to be ind in HEP for scar tissue, Nerve glides and tendon glides to  have decrease pain on PRWHE    Time 3   Period Weeks   Status New           OT Long Term Goals - 01/12/16 1551      OT LONG TERM GOAL #1   Title 3 point and lat grip improve by at least 3-5 lbs to carry and hold objects with L hand    Baseline 6 lbs for L hand and R  24 lbs    Time 4   Period Weeks   Status New     OT LONG TERM GOAL #2   Title Ulnar N healing improve for pt to show increase ROM /strength in  5th ADD, oppostion and 4th /5th lumbrical  fist - and decrease triggering of thumb   Baseline able to do only place and hold ADD, unable to do opposition , and place and hold only lumbrical - increase IP flexion of thumb - and triggering    Time 4   Period Weeks   Status New     OT LONG TERM GOAL #3   Title Function score on PRWHE improve with 10 points   Baseline at eval 14/50 for PRWHE function score   Time 4   Period Weeks   Status New               Plan - 01/12/16 1053    Clinical Impression Statement Pt present at eval about 9 wks out from L CTR and guyons canal release with cyst removal - pt show increase pain at thumb , triggering ,  decrease strength in grip and prehension - as well as decrease ADD, opposition of 5th - some sensory changes still and scar tissue over CT    Rehab Potential Good   OT Frequency 2x / week   OT Duration 4 weeks   OT Treatment/Interventions Self-care/ADL training;Fluidtherapy;Splinting;Patient/family education;Therapeutic exercises;Contrast Bath;Ultrasound;Iontophoresis;Parrafin;Therapeutic exercise;Scar mobilization;Passive range of motion;Manual Therapy   Plan assess progress with HEP - ? triggering decrease   OT Home Exercise Plan see pt instruction    Consulted and Agree with Plan of Care Patient      Patient will benefit from skilled therapeutic intervention in order to improve the following deficits and impairments:  Impaired flexibility, Decreased range of motion, Decreased coordination, Decreased scar mobility, Decreased knowledge of use of DME, Impaired UE functional use, Pain, Decreased strength  Visit Diagnosis: Other disturbances of skin sensation  Stiffness of left hand, not elsewhere classified  Muscle weakness (generalized)  Pain in left hand    Problem List Patient Active Problem List   Diagnosis Date Noted  .  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 Anxiety 01/30/2015  . Benign essential HTN 01/30/2015  . Clinical depression 01/30/2015  . Diabetes ([place])  01/30/2015  . Esophagitis, reflux 01/30/2015  . Hypercholesteremia 01/30/2015  . Adult hypothyroidism 01/30/2015  . Hemorrhoids, internal 01/30/2015  . Anemia, iron deficiency 01/30/2015  . Extreme obesity ([place]) 01/30/2015  . Breath shortness 01/30/2015  . Apnea, sleep 01/30/2015  . 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 Avitaminosis D 01/30/2015  . Type 1 diabetes mellitus without complication ([place]) [id number]  . Acquired hypothyroidism 04/26/2014  . CC (collagenous colitis) 10/31/2007    Rosalyn Gess OTR/L,CLT 01/12/2016, 3:59 PM  [place] [facility] [address] [place], [address] Phone: [phone]   Fax:  [phone]  Name: Kristin [name] MRN: [id number] Date of Birth: [birth year]

## 2016-01-12 NOTE — Patient Instructions (Signed)
Ed on contrast  scar massage and scar pad at night time  Scar pad for daytime use for padding over CT and Guyon's canal during computer act modifications of task - using palm avoid tight lateral and sustained lat grip   Ulnar and med N glide 5 reps  Tendon glides - place and hold for lumbrical ADD of 5th  -sliding on paper and place and hold

## 2016-01-16 ENCOUNTER — Ambulatory Visit: Payer: BLUE CROSS/BLUE SHIELD | Admitting: Occupational Therapy

## 2016-01-16 DIAGNOSIS — R208 Other disturbances of skin sensation: Principal | ICD-10-CM

## 2016-01-16 DIAGNOSIS — M6281 Muscle weakness (generalized): Secondary | ICD-10-CM

## 2016-01-16 DIAGNOSIS — M79642 Pain in left hand: Secondary | ICD-10-CM

## 2016-01-16 DIAGNOSIS — M25642 Stiffness of left hand, not elsewhere classified: Secondary | ICD-10-CM

## 2016-01-16 NOTE — Patient Instructions (Signed)
Same HEP  

## 2016-01-16 NOTE — Therapy (Signed)
[place] [facility] [address], [place], [address] Phone: [phone]   Fax:  [phone]  Occupational Therapy Treatment  Patient Details  Name: Kristin [name] MRN: [id number] Date of Birth: 1961-04-08 Referring Provider: Rudene Christians  Encounter Date: 01/16/2016      OT End of Session - 01/16/16 1410    Visit Number 2   Date for OT Re-Evaluation 02/09/16   OT Start Time 1215   OT Stop Time 1313   OT Time Calculation (min) 58 min   Activity Tolerance Patient tolerated treatment well   Behavior During Therapy Kristin [name] for tasks assessed/performed      Past Medical History:  Diagnosis Date  . Anxiety   . Depression   . Diabetes mellitus without complication ([place])    type 1  . Elevated cholesterol   . Hypertension   . Hypothyroidism     Past Surgical History:  Procedure Laterality Date  . BREAST BIOPSY Left    bx/clip-neg  . 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 CARPAL TUNNEL RELEASE Left 11/06/2015   Procedure: CARPAL TUNNEL RELEASE;  Surgeon: Hessie Knows, MD;  Location: [facility] ORS;  Service: Orthopedics;  Laterality: Left;  . ctr Right   . HAND SURGERY Right 2010   carpal tunnel  . THYROID LOBECTOMY Left 03/2000    There were no vitals filed for this visit.      Subjective Assessment - 01/16/16 1409    Subjective  Doing okay - did not had so many days since seen you - thumb still wants to trigger - pain not to bad    Patient Stated Goals want more dexterity , strength, in L hand - and thumb not to trigger -    Currently in Pain? No/denies                      OT Treatments/Exercises (OP) - 01/16/16 0001      LUE Paraffin   Number Minutes Paraffin 10 Minutes   LUE Paraffin Location Hand   Comments at [name] over ulnar side of hand -and CT to decreas e scar tissue prior to manual - but not over MP of thumb       Paraffin to R hand at [facility]     Did  [place] for soft tissue mobs and scar mobs over CT , and scar - brushing , sweeping and scooping with tool nr 2 and 4 - to decrease scar tissue, increase ROM and decrease pain  CT spreads done 10 x - tender over prox and distal scar ends - and some tightness over thenar eminence   Pt ed again on scar massage - - as well as reviewed wearing of scar pad  Tendon glides d  Thumb PA and RA   Wrist extention stretch - prayer stretch    Med N glide - needed min A  And Ulnar N glide - min A 5 reps each   CUS at 3.3MHZ,  0.8 intensity for scar tissue over CT prior to manual therapy to decrease scar tissue  Iontophoresis with dexamethazone done small patch and 2.0 current and 19 min over A1 pulley at thumb MP at end of session - removed patch and skin check done prior and end - educated what to expect at SOC     [place] at 20% at 3.3MHZ, 1.0 intensity,  for 5 min over CT at the  end of session             OT Education - 01/16/16 1410    Education provided Yes   Education Details HEP    Person(s) Educated Patient   Methods Explanation;Demonstration;Tactile cues;Verbal cues   Comprehension Verbal cues required;Returned demonstration;Verbalized understanding          OT Short Term Goals - 01/12/16 1548      OT SHORT TERM GOAL #1   Title Pain on PRWHE improve by at least 10 points    Baseline at eval 17/50 on PRWHE    Time 3   Period Weeks   Status New     OT SHORT TERM GOAL #2   Title Triggering on L thumb decrease to 1 x day to use in ADL's   Baseline triggering  several times during session - tenderness over A1pulley    Time 3   Period Weeks   Status New     OT SHORT TERM GOAL #3   Title pt to be ind in HEP for scar tissue, Nerve glides and tendon glides to  have decrease pain on PRWHE    Time 3   Period Weeks   Status New           OT Long Term Goals - 01/12/16 1551      OT LONG TERM GOAL #1   Title 3 point and lat grip improve by at least 3-5 lbs to  carry and hold objects with L hand    Baseline 6 lbs for L hand and R  24 lbs    Time 4   Period Weeks   Status New     OT LONG TERM GOAL #2   Title Ulnar N healing improve for pt to show increase ROM /strength in  5th ADD, oppostion and 4th /5th lumbrical fist - and decrease triggering of thumb   Baseline able to do only place and hold ADD, unable to do opposition , and place and hold only lumbrical - increase IP flexion of thumb - and triggering    Time 4   Period Weeks   Status New     OT LONG TERM GOAL #3   Title Function score on PRWHE improve with 10 points   Baseline at eval 14/50 for PRWHE function score   Time 4   Period Weeks   Status New               Plan - 01/16/16 1411    Clinical Impression Statement Pt scar tissue still tight and tender - trigger of thumb IP , decrease 4th and 5th PIP extention with MC at 90 , decrease ADD of 5th - and sensation impaired -    Rehab Potential Good   OT Frequency 2x / week   OT Duration 4 weeks   OT Treatment/Interventions Self-care/ADL training;Fluidtherapy;Splinting;Patient/family education;Therapeutic exercises;Contrast Bath;Ultrasound;Iontophoresis;Parrafin;Therapeutic exercise;Scar mobilization;Passive range of motion;Manual Therapy   Plan update HEP as needed - ? triggering   OT Home Exercise Plan see pt instruction    Consulted and Agree with Plan of Care Patient      Patient will benefit from skilled therapeutic intervention in order to improve the following deficits and impairments:  Impaired flexibility, Decreased range of motion, Decreased coordination, Decreased scar mobility, Decreased knowledge of use of DME, Impaired UE functional use, Pain, Decreased strength  Visit Diagnosis: Other disturbances of skin sensation  Stiffness of left hand, not elsewhere classified  Pain in left hand  Muscle  weakness (generalized)    Problem List Patient Active Problem List   Diagnosis Date Noted  . Neuropathic ulnar  nerve 08/29/2015  . Anxiety 01/30/2015  . Benign essential HTN 01/30/2015  . Clinical depression 01/30/2015  . Diabetes ([place]) 01/30/2015  . Esophagitis, reflux 01/30/2015  . Hypercholesteremia 01/30/2015  . Adult hypothyroidism 01/30/2015  . Hemorrhoids, internal 01/30/2015  . Anemia, iron deficiency 01/30/2015  . Extreme obesity ([place]) 01/30/2015  . Breath shortness 01/30/2015  . Apnea, sleep 01/30/2015  . 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 Avitaminosis D 01/30/2015  . Type 1 diabetes mellitus without complication ([place]) [id number]  . Acquired hypothyroidism 04/26/2014  . CC (collagenous colitis) 10/31/2007    Kristin [name],Kristin [name]  01/16/2016, 2:19 PM  [place] [facility] [address] [place], [address] Phone: [phone]   Fax:  [phone]  Name: Kristin [name] MRN: [id number] Date of Birth: May 20, 1961

## 2016-01-21 ENCOUNTER — Ambulatory Visit: Payer: BLUE CROSS/BLUE SHIELD | Admitting: Occupational Therapy

## 2016-01-21 DIAGNOSIS — R208 Other disturbances of skin sensation: Secondary | ICD-10-CM | POA: Diagnosis not present

## 2016-01-21 DIAGNOSIS — M25642 Stiffness of left hand, not elsewhere classified: Secondary | ICD-10-CM

## 2016-01-21 DIAGNOSIS — M6281 Muscle weakness (generalized): Secondary | ICD-10-CM

## 2016-01-21 DIAGNOSIS — M79642 Pain in left hand: Secondary | ICD-10-CM

## 2016-01-21 NOTE — Patient Instructions (Addendum)
Contrast prior    Pt ed again on scar massage - - as well as reviewed wearing of scar pad - she did not do it since last time Tendon glides - place and hold MC flexion with IP extention -  Thumb PA and RA   Wrist extention stretch - prayer stretch - pt did not do it at home - reinforce to do prior to  Nerve glides  Med N glide  And Ulnar N glide 5 reps each  bandaid on IP of thumb to decrease triggering - and ice as needed

## 2016-01-21 NOTE — Therapy (Signed)
[place] [facility] [address], [place], [address] Phone: [phone]   Fax:  [phone]  Occupational Therapy Treatment  Patient Details  Name: Kristin [name] MRN: [id number] Date of Birth: [birth year] Referring Provider: Rudene Christians  Encounter Date: 01/21/2016      OT End of Session - 01/21/16 1331    Visit Number 3   Number of Visits 8   Date for OT Re-Evaluation 02/09/16   OT Start Time 1316   OT Stop Time 1420   OT Time Calculation (min) 64 min   Activity Tolerance Patient tolerated treatment well   Behavior During Therapy [facility] for tasks assessed/performed      Past Medical History:  Diagnosis Date  . Anxiety   . Depression   . Diabetes mellitus without complication ([place])    type 1  . Elevated cholesterol   . Hypertension   . Hypothyroidism     Past Surgical History:  Procedure Laterality Date  . BREAST BIOPSY Left    bx/clip-neg  . 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 CARPAL TUNNEL RELEASE Left 11/06/2015   Procedure: CARPAL TUNNEL RELEASE;  Surgeon: Hessie Knows, MD;  Location: [facility] ORS;  Service: Orthopedics;  Laterality: Left;  . ctr Right   . HAND SURGERY Right 2010   carpal tunnel  . THYROID LOBECTOMY Left 03/2000    There were no vitals filed for this visit.      Subjective Assessment - 01/21/16 1328    Subjective  not really pain - that ring finger is moving better- can use my pinch grip better -using with papers - and can cut my finger nails better -   Patient Stated Goals want more dexterity , strength, in L hand - and thumb not to trigger -    Currently in Pain? No/denies            [facility] OT Assessment - 01/21/16 0001      Strength   Right Hand Grip (lbs) 74   Right Hand Lateral Pinch 24 lbs   Right Hand 3 Point Pinch 24 lbs   Left Hand Grip (lbs) 43   Left Hand Lateral Pinch 8 lbs   Left Hand 3 Point Pinch 6 lbs                   OT Treatments/Exercises (OP) - 01/21/16 0001      LUE Paraffin   Number Minutes Paraffin 10 Minutes   LUE Paraffin Location Hand   Comments At [facility] to decreas scar tissue - and pain  - over palm but not thumb A1pulley      Paraffin to R hand at [facility]     Did [place] tools for soft tissue mobs and scar mobs over CT , and scar - brushing , sweeping and scooping with tool nr 2 and 4 - to decrease scar tissue, increase ROM and decrease pain  And over A1pulley at thumb - some tightness but less over thenar eminence this date  CT spreads done 10 x - tender over prox and distal scar ends   Pt ed again on scar massage - - as well as reviewed wearing of scar pad - she did not do it since last time Tendon glides - place and hold MC flexion with IP extention - pt was able to do it actively this date 3 x  Thumb PA and RA   Wrist  extention stretch - prayer stretch - pt did not do it at home - reinforce to do prior to  Nerve glides  Med N glide  And Ulnar N glide 5 reps each  Iontophoresis with dexamethazone done small patch and 2.0 current and 19 min over A1 pulley at thumb MP at end of session - pt ed use -  skin check done prior - pt to keep it on for hour aftewards                 OT Education - 01/21/16 1331    Education provided Yes   Education Details HEP    Person(s) Educated Patient   Methods Explanation;Demonstration;Tactile cues;Handout;Verbal cues   Comprehension Returned demonstration;Verbal cues required;Verbalized understanding          OT Short Term Goals - 01/12/16 1548      OT SHORT TERM GOAL #1   Title Pain on PRWHE improve by at least 10 points    Baseline at eval 17/50 on PRWHE    Time 3   Period Weeks   Status New     OT SHORT TERM GOAL #2   Title Triggering on L thumb decrease to 1 x day to use in ADL's   Baseline triggering  several times during session - tenderness over A1pulley    Time 3   Period Weeks    Status New     OT SHORT TERM GOAL #3   Title pt to be ind in HEP for scar tissue, Nerve glides and tendon glides to  have decrease pain on PRWHE    Time 3   Period Weeks   Status New           OT Long Term Goals - 01/12/16 1551      OT LONG TERM GOAL #1   Title 3 point and lat grip improve by at least 3-5 lbs to carry and hold objects with L hand    Baseline 6 lbs for L hand and R  24 lbs    Time 4   Period Weeks   Status New     OT LONG TERM GOAL #2   Title Ulnar N healing improve for pt to show increase ROM /strength in  5th ADD, oppostion and 4th /5th lumbrical fist - and decrease triggering of thumb   Baseline able to do only place and hold ADD, unable to do opposition , and place and hold only lumbrical - increase IP flexion of thumb - and triggering    Time 4   Period Weeks   Status New     OT LONG TERM GOAL #3   Title Function score on PRWHE improve with 10 points   Baseline at eval 14/50 for PRWHE function score   Time 4   Period Weeks   Status New               Plan - 01/21/16 1405    Clinical Impression Statement Pt report able to do better pinch grip flipping thru paper and using nail clippers - still tender over scar , some adhesions , and tender A1pulley at thumb - ionot 2nd session this date    Rehab Potential Good   OT Frequency 2x / week   OT Duration 4 weeks   OT Treatment/Interventions Self-care/ADL training;Fluidtherapy;Splinting;Patient/family education;Therapeutic exercises;Contrast Bath;Ultrasound;Iontophoresis;Parrafin;Therapeutic exercise;Scar mobilization;Passive range of motion;Manual Therapy   Plan assess progress - update HEP    OT Home Exercise Plan see pt instruction  Consulted and Agree with Plan of Care Patient      Patient will benefit from skilled therapeutic intervention in order to improve the following deficits and impairments:  Impaired flexibility, Decreased range of motion, Decreased coordination, Decreased scar  mobility, Decreased knowledge of use of DME, Impaired UE functional use, Pain, Decreased strength  Visit Diagnosis: Other disturbances of skin sensation  Pain in left hand  Muscle weakness (generalized)  Stiffness of left hand, not elsewhere classified    Problem List Patient Active Problem List   Diagnosis Date Noted  . 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 Anxiety 01/30/2015  . Benign essential HTN 01/30/2015  . Clinical depression 01/30/2015  . Diabetes ([place]) 01/30/2015  . Esophagitis, reflux 01/30/2015  . Hypercholesteremia 01/30/2015  . Adult hypothyroidism 01/30/2015  . Hemorrhoids, internal 01/30/2015  . Anemia, iron deficiency 01/30/2015  . Extreme obesity ([place]) 01/30/2015  . Breath shortness 01/30/2015  . Apnea, sleep 01/30/2015  . 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 Avitaminosis D 01/30/2015  . Type 1 diabetes mellitus without complication ([place]) [id number]  . Acquired hypothyroidism 04/26/2014  . CC (collagenous colitis) 10/31/2007    Rosalyn Gess OTR/L,CLT  01/21/2016, 2:07 PM  [place] [facility] [address], [place], [address] Phone: [phone]   Fax:  [phone]  Name: Kristin [name] MRN: [id number] Date of Birth: [birth year]

## 2016-01-23 ENCOUNTER — Ambulatory Visit: Payer: BLUE CROSS/BLUE SHIELD | Attending: Orthopedic Surgery | Admitting: Occupational Therapy

## 2016-01-23 DIAGNOSIS — M6281 Muscle weakness (generalized): Secondary | ICD-10-CM | POA: Insufficient documentation

## 2016-01-23 DIAGNOSIS — R208 Other disturbances of skin sensation: Secondary | ICD-10-CM | POA: Insufficient documentation

## 2016-01-23 DIAGNOSIS — M25642 Stiffness of left hand, not elsewhere classified: Secondary | ICD-10-CM | POA: Insufficient documentation

## 2016-01-23 DIAGNOSIS — M79642 Pain in left hand: Secondary | ICD-10-CM | POA: Insufficient documentation

## 2016-01-23 NOTE — Therapy (Signed)
[facility] [address], [place], [address] Phone: [phone]   Fax:  [phone]  Occupational Therapy Treatment  Patient Details  Name: Kristin [name] MRN: [id number] Date of Birth: 11/15/1960 Referring Provider: Rudene Christians  Encounter Date: 01/23/2016      OT End of Session - 01/23/16 1501    Visit Number 4   Number of Visits 8   Date for OT Re-Evaluation 02/09/16   OT Start Time 1310   OT Stop Time 1400   OT Time Calculation (min) 50 min   Activity Tolerance Patient tolerated treatment well   Behavior During Therapy [facility] for tasks assessed/performed      Past Medical History:  Diagnosis Date  . Anxiety   . Depression   . Diabetes mellitus without complication ([place])    type 1  . Elevated cholesterol   . Hypertension   . Hypothyroidism     Past Surgical History:  Procedure Laterality Date  . BREAST BIOPSY Left    bx/clip-neg  . 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 CARPAL TUNNEL RELEASE Left 11/06/2015   Procedure: CARPAL TUNNEL RELEASE;  Surgeon: Hessie Knows, MD;  Location: [facility] ORS;  Service: Orthopedics;  Laterality: Left;  . ctr Right   . HAND SURGERY Right 2010   carpal tunnel  . THYROID LOBECTOMY Left 03/2000    There were no vitals filed for this visit.      Subjective Assessment - 01/23/16 1459    Subjective  Doing okay - got my finger not to trigger  with the bandaid on - and can pinch better - stronger - can can keep ring finger more straight with that one exercises   Patient Stated Goals want more dexterity , strength, in L hand - and thumb not to trigger -    Currently in Pain? No/denies                      OT Treatments/Exercises (OP) - 01/23/16 0001      LUE Paraffin   Number Minutes Paraffin 10 Minutes   LUE Paraffin Location Hand   Comments At [facility] to decrease scar tissue  at CT  and pain       Paraffin to R hand  at [facility]   Prior to Scar tissue massage  CUS at 3.3MHZ for 0.8 intensity - 49min  Did Graston tools for soft tissue mobs and scar mobs over CT , and scar - brushing , sweeping and scooping with tool nr 2 and 4 - to decrease scar tissue, increase ROM and decrease pain  And over A1pulley at thumb - some tightness but less over thenar eminence this date  - scar tight and tender proximal more than distal CT spreads done 10 x -   Pt ed again on scar massage again and wearing of scar pad   Tendon glides - place and hold MC flexion with IP extention - pt was able to do it actively this date few times - with more ease Thumb PA and RA  AROM  Oppostion this date to base of 5th without thumb triggering - pt to cont with bandaid on IP to prevent locking during day - off 3 x for ROM   Wrist extention stretch - prayer stretch   Med N glide  And Ulnar N glide 5 reps each  Iontophoresis with dexamethazone done small patch and 2.0  current and 19 min over A1 pulley at thumb MP at end of session - pt ed use -  skin check done prior - pt to keep it on for hour aftewards           OT Education - 01/23/16 1501    Education provided Yes   Education Details HEP   Person(s) Educated Patient   Methods Explanation;Demonstration;Tactile cues;Verbal cues   Comprehension Verbal cues required;Returned demonstration;Verbalized understanding          OT Short Term Goals - 01/12/16 1548      OT SHORT TERM GOAL #1   Title Pain on PRWHE improve by at least 10 points    Baseline at eval 17/50 on PRWHE    Time 3   Period Weeks   Status New     OT SHORT TERM GOAL #2   Title Triggering on L thumb decrease to 1 x day to use in ADL's   Baseline triggering  several times during session - tenderness over A1pulley    Time 3   Period Weeks   Status New     OT SHORT TERM GOAL #3   Title pt to be ind in HEP for scar tissue, Nerve glides and tendon glides to  have decrease pain on PRWHE    Time 3    Period Weeks   Status New           OT Long Term Goals - 01/12/16 1551      OT LONG TERM GOAL #1   Title 3 point and lat grip improve by at least 3-5 lbs to carry and hold objects with L hand    Baseline 6 lbs for L hand and R  24 lbs    Time 4   Period Weeks   Status New     OT LONG TERM GOAL #2   Title Ulnar N healing improve for pt to show increase ROM /strength in  5th ADD, oppostion and 4th /5th lumbrical fist - and decrease triggering of thumb   Baseline able to do only place and hold ADD, unable to do opposition , and place and hold only lumbrical - increase IP flexion of thumb - and triggering    Time 4   Period Weeks   Status New     OT LONG TERM GOAL #3   Title Function score on PRWHE improve with 10 points   Baseline at eval 14/50 for PRWHE function score   Time 4   Period Weeks   Status New               Plan - 01/23/16 1502    Rehab Potential Good   OT Frequency 2x / week   OT Duration 4 weeks   OT Treatment/Interventions Self-care/ADL training;Fluidtherapy;Splinting;Patient/family education;Therapeutic exercises;Contrast Bath;Ultrasound;Iontophoresis;Parrafin;Therapeutic exercise;Scar mobilization;Passive range of motion;Manual Therapy   Plan assess progress with scar - triggering thumb   OT Home Exercise Plan see pt instruction    Consulted and Agree with Plan of Care Patient      Patient will benefit from skilled therapeutic intervention in order to improve the following deficits and impairments:  Impaired flexibility, Decreased range of motion, Decreased coordination, Decreased scar mobility, Decreased knowledge of use of DME, Impaired UE functional use, Pain, Decreased strength  Visit Diagnosis: Other disturbances of skin sensation  Pain in left hand  Muscle weakness (generalized)  Stiffness of left hand, not elsewhere classified    Problem List Patient Active Problem List  Diagnosis Date Noted  . 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 Anxiety 01/30/2015  . Benign essential HTN 01/30/2015  . Clinical depression 01/30/2015  . Diabetes ([place]) 01/30/2015  . Esophagitis, reflux 01/30/2015  . Hypercholesteremia 01/30/2015  . Adult hypothyroidism 01/30/2015  . Hemorrhoids, internal 01/30/2015  . Anemia, iron deficiency 01/30/2015  . Extreme obesity ([place]) 01/30/2015  . Breath shortness 01/30/2015  . Apnea, sleep 01/30/2015  . 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 Avitaminosis D 01/30/2015  . Type 1 diabetes mellitus without complication ([place]) [id number]  . Acquired hypothyroidism 04/26/2014  . CC (collagenous colitis) 10/31/2007    Rosalyn Gess OTR/L,CLT  01/23/2016, 3:07 PM  [place] [facility] [address] [place], [address] Phone: [phone]   Fax:  [phone]  Name: Kristin [name] MRN: [id number] Date of Birth: 1960-08-03

## 2016-01-23 NOTE — Patient Instructions (Signed)
Same HEP  scar massage and scar pad   Tendon glides - place and hold MC flexion with IP extention  Thumb PA and RA  AROM  Oppostion this date to base of 5th without thumb triggering - pt to cont with bandaid on IP to prevent locking during day - off 3 x for ROM   Wrist extention stretch - prayer stretch   Med N glide  And Ulnar N glide 5 reps each

## 2016-01-27 ENCOUNTER — Ambulatory Visit: Payer: BLUE CROSS/BLUE SHIELD | Admitting: Occupational Therapy

## 2016-01-27 DIAGNOSIS — R208 Other disturbances of skin sensation: Principal | ICD-10-CM

## 2016-01-27 DIAGNOSIS — M6281 Muscle weakness (generalized): Secondary | ICD-10-CM

## 2016-01-27 DIAGNOSIS — M25642 Stiffness of left hand, not elsewhere classified: Secondary | ICD-10-CM

## 2016-01-27 DIAGNOSIS — M79642 Pain in left hand: Secondary | ICD-10-CM

## 2016-01-27 NOTE — Therapy (Signed)
[place] [facility] [address], [place], [address] Phone: [phone]   Fax:  [phone]  Occupational Therapy Treatment  Patient Details  Name: Kristin [name] MRN: [id number] Date of Birth: [birth year] Referring Provider: Rudene Christians  Encounter Date: 01/27/2016      OT End of Session - 01/27/16 1931    Visit Number 5   Number of Visits 8   Date for OT Re-Evaluation 02/09/16   OT Start Time 1427   OT Stop Time 1515   OT Time Calculation (min) 48 min   Activity Tolerance Patient tolerated treatment well   Behavior During Therapy [facility] for tasks assessed/performed      Past Medical History:  Diagnosis Date  . Anxiety   . Depression   . Diabetes mellitus without complication ([place])    type 1  . Elevated cholesterol   . Hypertension   . Hypothyroidism     Past Surgical History:  Procedure Laterality Date  . BREAST BIOPSY Left    bx/clip-neg  . 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 CARPAL TUNNEL RELEASE Left 11/06/2015   Procedure: CARPAL TUNNEL RELEASE;  Surgeon: Hessie Knows, MD;  Location: [facility] ORS;  Service: Orthopedics;  Laterality: Left;  . ctr Right   . HAND SURGERY Right 2010   carpal tunnel  . THYROID LOBECTOMY Left 03/2000    There were no vitals filed for this visit.      Subjective Assessment - 01/27/16 1925    Subjective  My thumb want to trigger still and tenderness at base of thumb some times more than other times- need to keep bandaid on at night time too -  otherwise in am tender and  triggering - but have to say the ulnar N coming along    Patient Stated Goals want more dexterity , strength, in L hand - and thumb not to trigger -    Currently in Pain? Yes   Pain Score 2    Pain Location Finger (Comment which one)   Pain Orientation Left   Pain Descriptors / Indicators Tender   Pain Radiating Towards  palpation at A1 pulley                       OT Treatments/Exercises (OP) - 01/27/16 0001      LUE Paraffin   Number Minutes Paraffin 10 Minutes   LUE Paraffin Location Hand   Comments at [facility] to decrease scar tissue and  pain        Paraffin to R hand at [facility]   Did Graston tools for soft tissue mobs and scar mobs over CT , and thenar eminence , and A1 pulley  - brushing , sweeping and scooping with tool nr 2 and 4 - to decrease scar tissue, increase ROM and decrease pain   A1pulley at thumb - some tightness but less over thenar eminence    CT spreads done 10 x -    Tendon glides - ABLE to DO nOW  MC flexion with IP extention  AROM at least 8 reps Thumb PA and RA  AROM  Oppostion this date to distal  5th without thumb triggering - pt to cont with bandaid on IP to prevent locking during day - off 3 x for ROM   Wrist extention stretch - prayer stretch to cont with   Med N glide  And Ulnar N glide 5  reps each Notice pt do thumb flexion with increase IP flexion during Nerve glide and tendon glide  Iontophoresis with dexamethazone done small patch and 2.0 current and 19 min over A1 pulley at thumb MP at end of session - pt ed use - skin check done prior - pt to keep it on for hour aftewards           OT Education - 01/27/16 1931    Education provided Yes   Education Details HEP changes   Person(s) Educated Patient   Methods Explanation;Demonstration;Tactile cues;Verbal cues   Comprehension Verbal cues required;Returned demonstration;Verbalized understanding          OT Short Term Goals - 01/12/16 1548      OT SHORT TERM GOAL #1   Title Pain on PRWHE improve by at least 10 points    Baseline at eval 17/50 on PRWHE    Time 3   Period Weeks   Status New     OT SHORT TERM GOAL #2   Title Triggering on L thumb decrease to 1 x day to use in ADL's   Baseline triggering  several times during session - tenderness over A1pulley    Time 3   Period Weeks   Status New     OT  SHORT TERM GOAL #3   Title pt to be ind in HEP for scar tissue, Nerve glides and tendon glides to  have decrease pain on PRWHE    Time 3   Period Weeks   Status New           OT Long Term Goals - 01/12/16 1551      OT LONG TERM GOAL #1   Title 3 point and lat grip improve by at least 3-5 lbs to carry and hold objects with L hand    Baseline 6 lbs for L hand and R  24 lbs    Time 4   Period Weeks   Status New     OT LONG TERM GOAL #2   Title Ulnar N healing improve for pt to show increase ROM /strength in  5th ADD, oppostion and 4th /5th lumbrical fist - and decrease triggering of thumb   Baseline able to do only place and hold ADD, unable to do opposition , and place and hold only lumbrical - increase IP flexion of thumb - and triggering    Time 4   Period Weeks   Status New     OT LONG TERM GOAL #3   Title Function score on PRWHE improve with 10 points   Baseline at eval 14/50 for PRWHE function score   Time 4   Period Weeks   Status New               Plan - 01/27/16 1932    Clinical Impression Statement Pt showing progress in ulnar N with increase MC flexion with PIP extention at 4th , increase ADD of 5th - but because of increaes IP flexion of thumb - triggering still cont and tenderenss at  A1pulley of thumb    Rehab Potential Good   OT Frequency 2x / week   OT Duration 4 weeks   OT Treatment/Interventions Self-care/ADL training;Fluidtherapy;Splinting;Patient/family education;Therapeutic exercises;Contrast Bath;Ultrasound;Iontophoresis;Parrafin;Therapeutic exercise;Scar mobilization;Passive range of motion;Manual Therapy   Plan assess pain and triggering    OT Home Exercise Plan see pt instruction    Consulted and Agree with Plan of Care Patient      Patient will benefit from skilled therapeutic  intervention in order to improve the following deficits and impairments:  Impaired flexibility, Decreased range of motion, Decreased coordination, Decreased scar  mobility, Decreased knowledge of use of DME, Impaired UE functional use, Pain, Decreased strength  Visit Diagnosis: Other disturbances of skin sensation  Pain in left hand  Muscle weakness (generalized)  Stiffness of left hand, not elsewhere classified    Problem List Patient Active Problem List   Diagnosis Date Noted  . 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 Anxiety 01/30/2015  . Benign essential HTN 01/30/2015  . Clinical depression 01/30/2015  . Diabetes ([place]) 01/30/2015  . Esophagitis, reflux 01/30/2015  . Hypercholesteremia 01/30/2015  . Adult hypothyroidism 01/30/2015  . Hemorrhoids, internal 01/30/2015  . Anemia, iron deficiency 01/30/2015  . Extreme obesity ([place]) 01/30/2015  . Breath shortness 01/30/2015  . Apnea, sleep 01/30/2015  . 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 Avitaminosis D 01/30/2015  . Type 1 diabetes mellitus without complication ([place]) [id number]  . Acquired hypothyroidism 04/26/2014  . CC (collagenous colitis) 10/31/2007    Rosalyn Gess OTR/L,CLT 01/27/2016, 7:35 PM  [place] [facility] [address], [place], [address] Phone: [phone]   Fax:  [phone]  Name: Kristin [name] MRN: [id number] Date of Birth: Jul 19, 1960

## 2016-01-27 NOTE — Patient Instructions (Signed)
Cont scar massage  Tendon glides - ABLE to DO nOW  MC flexion with IP extention  AROM at least 8 reps Thumb PA and RA  AROM  Oppostion this date to distal  5th without thumb triggering - pt to cont with bandaid on IP to prevent locking during day - off 3 x for ROM   Wrist extention stretch - prayer stretch to cont with   Med N glide  And Ulnar N glide 5 reps each Notice pt do thumb flexion with increase IP flexion during Nerve glide and tendon glide  - pt to keep thumb out and IP neutral

## 2016-01-30 ENCOUNTER — Ambulatory Visit: Payer: BLUE CROSS/BLUE SHIELD | Admitting: Occupational Therapy

## 2016-01-30 DIAGNOSIS — R208 Other disturbances of skin sensation: Principal | ICD-10-CM

## 2016-01-30 DIAGNOSIS — M6281 Muscle weakness (generalized): Secondary | ICD-10-CM

## 2016-01-30 DIAGNOSIS — M79642 Pain in left hand: Secondary | ICD-10-CM

## 2016-01-30 DIAGNOSIS — M25642 Stiffness of left hand, not elsewhere classified: Secondary | ICD-10-CM

## 2016-01-30 NOTE — Patient Instructions (Signed)
Heat or contrast  Tendon glides ADD of thumb AROM -place and hold Thumb PA and RA  AROM ,  Oppostion this date to  5th without thumb triggering - pt to cont with bandaid on IP to prevent locking during day - off 3 x for ROM   Wrist extention stretch - prayer stretch   Med N glide  And Ulnar N glide 5 reps each

## 2016-01-30 NOTE — Therapy (Signed)
[facility] [address] [place], [address] Phone: [phone]   Fax:  [phone]  Occupational Therapy Treatment  Patient Details  Name: Kristin [name] MRN: [id number] Date of Birth: March 20, 1961 Referring Provider: Rudene Christians  Encounter Date: 01/30/2016      OT End of Session - 01/30/16 1407    Visit Number 6   Number of Visits 8   Date for OT Re-Evaluation 02/09/16   OT Start Time 1315   OT Stop Time 1413   OT Time Calculation (min) 58 min   Activity Tolerance Patient tolerated treatment well   Behavior During Therapy [facility] for tasks assessed/performed      Past Medical History:  Diagnosis Date  . Anxiety   . Depression   . Diabetes mellitus without complication ([place])    type 1  . Elevated cholesterol   . Hypertension   . Hypothyroidism     Past Surgical History:  Procedure Laterality Date  . BREAST BIOPSY Left    bx/clip-neg  . 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 CARPAL TUNNEL RELEASE Left 11/06/2015   Procedure: CARPAL TUNNEL RELEASE;  Surgeon: Hessie Knows, MD;  Location: [facility] ORS;  Service: Orthopedics;  Laterality: Left;  . ctr Right   . HAND SURGERY Right 2010   carpal tunnel  . THYROID LOBECTOMY Left 03/2000    There were no vitals filed for this visit.      Subjective Assessment - 01/30/16 1405    Subjective  I still cannot pinch to good - but I can tell it is getting better - and even the triggering - I am still keeping the bandaid on to prevent it -tenderness better at base of thumb    Patient Stated Goals want more dexterity , strength, in L hand - and thumb not to trigger -    Currently in Pain? No/denies            [place] OT Assessment - 01/30/16 0001      Strength   Right Hand Grip (lbs) 74   Right Hand Lateral Pinch 24 lbs   Right Hand 3 Point Pinch 24 lbs   Left Hand Grip (lbs) 50   Left Hand Lateral Pinch 10 lbs   Left Hand  3 Point Pinch 9 lbs                  OT Treatments/Exercises (OP) - 01/30/16 0001      LUE Paraffin   Number Minutes Paraffin 10 Minutes   LUE Paraffin Location Hand   Comments at [facility] to decrease scar tissue  at CT and guyongs canal scar      Assess grip and prehension  As well as progress in nerve healing and scar - AROM and strength  Paraffin to R hand at [facility] for soft tissue mobs and scar mobs over CT , and scar - brushing , sweeping and scooping with tool nr 2 and 4 - to decrease scar tissue, increase ROM and decrease pain  And over A1pulley at thumb -pt report decrease pain at A1pulley and scar improved greatly - softer  CT spreads done 10 x     Tendon glides - place and hold MC flexion with IP extention - pt was able to do it actively this date few times  Thumb PA and RA  AROM , and Adduction Oppostion this date to  5th without thumb triggering - pt to cont with bandaid on IP to prevent locking during day - off 3 x for ROM   Wrist extention stretch - prayer stretch   Med N glide  And Ulnar N glide 5 reps each  Iontophoresis with dexamethazone done small patch and 2.0 current and 19 min over A1 pulley at thumb MP at end of session - pt ed use - skin check done prior - pt to keep it on for hour aftewards            OT Education - 01/30/16 1407    Education provided Yes   Education Details progress and HEP to cont with and nerve healing   Person(s) Educated Patient   Methods Explanation;Verbal cues;Tactile cues;Demonstration   Comprehension Verbalized understanding;Returned demonstration;Verbal cues required          OT Short Term Goals - 01/12/16 1548      OT SHORT TERM GOAL #1   Title Pain on PRWHE improve by at least 10 points    Baseline at eval 17/50 on PRWHE    Time 3   Period Weeks   Status New     OT SHORT TERM GOAL #2   Title Triggering on L thumb decrease to 1 x day to use in ADL's   Baseline triggering   several times during session - tenderness over A1pulley    Time 3   Period Weeks   Status New     OT SHORT TERM GOAL #3   Title pt to be ind in HEP for scar tissue, Nerve glides and tendon glides to  have decrease pain on PRWHE    Time 3   Period Weeks   Status New           OT Long Term Goals - 01/12/16 1551      OT LONG TERM GOAL #1   Title 3 point and lat grip improve by at least 3-5 lbs to carry and hold objects with L hand    Baseline 6 lbs for L hand and R  24 lbs    Time 4   Period Weeks   Status New     OT LONG TERM GOAL #2   Title Ulnar N healing improve for pt to show increase ROM /strength in  5th ADD, oppostion and 4th /5th lumbrical fist - and decrease triggering of thumb   Baseline able to do only place and hold ADD, unable to do opposition , and place and hold only lumbrical - increase IP flexion of thumb - and triggering    Time 4   Period Weeks   Status New     OT LONG TERM GOAL #3   Title Function score on PRWHE improve with 10 points   Baseline at eval 14/50 for PRWHE function score   Time 4   Period Weeks   Status New               Plan - 01/30/16 1408    Clinical Impression Statement Pt showed progress in grip and prehension strength - increase lumbrical - but interossieus of 5th still decrease and triggeing during opposition to 5th or lateral grip - but ulnar N healing progressing and scar responded great to Graston this date    Rehab Potential Good   OT Frequency 2x / week   OT Duration 2 weeks   OT Treatment/Interventions Self-care/ADL training;Fluidtherapy;Splinting;Patient/family education;Therapeutic exercises;Contrast Bath;Ultrasound;Iontophoresis;Parrafin;Therapeutic exercise;Scar mobilization;Passive range of motion;Manual Therapy   Plan  assess scar tissue , decrease in triggering , ADD of 5th    OT Home Exercise Plan see pt instruction    Consulted and Agree with Plan of Care Patient      Patient will benefit from skilled  therapeutic intervention in order to improve the following deficits and impairments:  Impaired flexibility, Decreased range of motion, Decreased coordination, Decreased scar mobility, Decreased knowledge of use of DME, Impaired UE functional use, Pain, Decreased strength  Visit Diagnosis: Other disturbances of skin sensation  Pain in left hand  Muscle weakness (generalized)  Stiffness of left hand, not elsewhere classified    Problem List Patient Active Problem List   Diagnosis Date Noted  . 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 Anxiety 01/30/2015  . Benign essential HTN 01/30/2015  . Clinical depression 01/30/2015  . Diabetes ([place]) 01/30/2015  . Esophagitis, reflux 01/30/2015  . Hypercholesteremia 01/30/2015  . Adult hypothyroidism 01/30/2015  . Hemorrhoids, internal 01/30/2015  . Anemia, iron deficiency 01/30/2015  . Extreme obesity ([place]) 01/30/2015  . Breath shortness 01/30/2015  . Apnea, sleep 01/30/2015  . 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 Avitaminosis D 01/30/2015  . Type 1 diabetes mellitus without complication ([place]) [id number]  . Acquired hypothyroidism 04/26/2014  . CC (collagenous colitis) 10/31/2007    Rosalyn Gess OTR/L,CLT  01/30/2016, 2:17 PM  [place] [facility] [address], [place], [address] Phone: [phone]   Fax:  [phone]  Name: Kristin [name] MRN: [id number] Date of Birth: Sep 24, 1960

## 2016-02-03 ENCOUNTER — Ambulatory Visit: Payer: BLUE CROSS/BLUE SHIELD | Admitting: Occupational Therapy

## 2016-02-03 DIAGNOSIS — R208 Other disturbances of skin sensation: Secondary | ICD-10-CM | POA: Diagnosis not present

## 2016-02-03 DIAGNOSIS — M79642 Pain in left hand: Secondary | ICD-10-CM

## 2016-02-03 DIAGNOSIS — M25642 Stiffness of left hand, not elsewhere classified: Secondary | ICD-10-CM

## 2016-02-03 DIAGNOSIS — M6281 Muscle weakness (generalized): Secondary | ICD-10-CM

## 2016-02-03 NOTE — Patient Instructions (Addendum)
Cont with same HEP  

## 2016-02-03 NOTE — Therapy (Signed)
[place] [facility] [address] [place], [address] Phone: [phone]   Fax:  [phone]  Occupational Therapy Treatment  Patient Details  Name: Kristin [name] MRN: [id number] Date of Birth: [birth year] Referring Provider: Rudene Christians  Encounter Date: 02/03/2016      OT End of Session - 02/03/16 1348    Visit Number 7   Number of Visits 8   Date for OT Re-Evaluation 02/09/16   OT Start Time 1332   OT Stop Time 1432   OT Time Calculation (min) 60 min   Activity Tolerance Patient tolerated treatment well   Behavior During Therapy [facility] for tasks assessed/performed      Past Medical History:  Diagnosis Date  . Anxiety   . Depression   . Diabetes mellitus without complication ([place])    type 1  . Elevated cholesterol   . Hypertension   . Hypothyroidism     Past Surgical History:  Procedure Laterality Date  . BREAST BIOPSY Left    bx/clip-neg  . 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 CARPAL TUNNEL RELEASE Left 11/06/2015   Procedure: CARPAL TUNNEL RELEASE;  Surgeon: Hessie Knows, MD;  Location: [facility] ORS;  Service: Orthopedics;  Laterality: Left;  . ctr Right   . HAND SURGERY Right 2010   carpal tunnel  . THYROID LOBECTOMY Left 03/2000    There were no vitals filed for this visit.      Subjective Assessment - 02/03/16 1343    Subjective  My thumb still want to click stilll - if I don't have bandaid on - pain gets up to 2-3/1- when I forget it at night time    Patient Stated Goals want more dexterity , strength, in L hand - and thumb not to trigger -    Currently in Pain? No/denies                      OT Treatments/Exercises (OP) - 02/03/16 0001      LUE Paraffin   Number Minutes Paraffin 10 Minutes   LUE Paraffin Location Hand   Comments At [facility] to decrease scar tissue and pain     Assess use of hand - can carry 10 lbs with no issues, pushing up  from chair - but cutting with knife and fork - thumb IP flexion because of Ulnar N - cannot hold fork correctly  Paraffin to R hand at [place] for soft tissue mobs and scar mobs over CT , and scar - brushing  with tool nr 2 and 4 - to decrease scar tissue, increase ROM and decrease pain   -used vibration - with great results to soften scar  And over A1pulley at thumb -pt report decrease pain at A1pulley  CT spreads done 10 x     Tendon glides - place and hold MC flexion with IP extention - pt was able to do it actively still  Increase ADD of 5th - place and hold   Oppostion this date to  5th without thumb triggering - pt to cont with bandaid on IP to prevent locking during day - off 3 x for ROM   Wrist extention stretch - prayer stretch - and followed by table slides - no pain   Med N glide  And Ulnar N glide 5 reps each  Iontophoresis with dexamethazone done small patch and 2.0 current and 19  min over A1 pulley at thumb MP at end of session - pt ed use - skin check done prior - pt to keep it on for hour aftewards              OT Education - 02/03/16 1347    Education provided Yes   Education Details HEP review   Person(s) Educated Patient   Methods Explanation;Demonstration;Tactile cues   Comprehension Returned demonstration;Verbalized understanding          OT Short Term Goals - 01/12/16 1548      OT SHORT TERM GOAL #1   Title Pain on PRWHE improve by at least 10 points    Baseline at eval 17/50 on PRWHE    Time 3   Period Weeks   Status New     OT SHORT TERM GOAL #2   Title Triggering on L thumb decrease to 1 x day to use in ADL's   Baseline triggering  several times during session - tenderness over A1pulley    Time 3   Period Weeks   Status New     OT SHORT TERM GOAL #3   Title pt to be ind in HEP for scar tissue, Nerve glides and tendon glides to  have decrease pain on PRWHE    Time 3   Period Weeks   Status New            OT Long Term Goals - 01/12/16 1551      OT LONG TERM GOAL #1   Title 3 point and lat grip improve by at least 3-5 lbs to carry and hold objects with L hand    Baseline 6 lbs for L hand and R  24 lbs    Time 4   Period Weeks   Status New     OT LONG TERM GOAL #2   Title Ulnar N healing improve for pt to show increase ROM /strength in  5th ADD, oppostion and 4th /5th lumbrical fist - and decrease triggering of thumb   Baseline able to do only place and hold ADD, unable to do opposition , and place and hold only lumbrical - increase IP flexion of thumb - and triggering    Time 4   Period Weeks   Status New     OT LONG TERM GOAL #3   Title Function score on PRWHE improve with 10 points   Baseline at eval 14/50 for PRWHE function score   Time 4   Period Weeks   Status New               Plan - 02/03/16 1349    Clinical Impression Statement Ulnar N healing but thumb still triggering - tenderness at A1pulley better - but with Ulnar N - still showing increase IP flexion of thumb with any loading prehension grip - causing triggering     Rehab Potential Good   OT Frequency 2x / week   OT Duration 2 weeks   OT Treatment/Interventions Self-care/ADL training;Fluidtherapy;Splinting;Patient/family education;Therapeutic exercises;Contrast Bath;Ultrasound;Iontophoresis;Parrafin;Therapeutic exercise;Scar mobilization;Passive range of motion;Manual Therapy   OT Home Exercise Plan see pt instruction    Consulted and Agree with Plan of Care Patient      Patient will benefit from skilled therapeutic intervention in order to improve the following deficits and impairments:  Impaired flexibility, Decreased range of motion, Decreased coordination, Decreased scar mobility, Decreased knowledge of use of DME, Impaired UE functional use, Pain, Decreased strength  Visit Diagnosis: Other disturbances of skin sensation  Pain in left hand  Muscle weakness (generalized)  Stiffness of left hand, not  elsewhere classified    Problem List Patient Active Problem List   Diagnosis Date Noted  . 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 Anxiety 01/30/2015  . Benign essential HTN 01/30/2015  . Clinical depression 01/30/2015  . Diabetes ([place]) 01/30/2015  . Esophagitis, reflux 01/30/2015  . Hypercholesteremia 01/30/2015  . Adult hypothyroidism 01/30/2015  . Hemorrhoids, internal 01/30/2015  . Anemia, iron deficiency 01/30/2015  . Extreme obesity ([place]) 01/30/2015  . Breath shortness 01/30/2015  . Apnea, sleep 01/30/2015  . 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 Avitaminosis D 01/30/2015  . Type 1 diabetes mellitus without complication ([place]) [id number]  . Acquired hypothyroidism 04/26/2014  . CC (collagenous colitis) 10/31/2007    Rosalyn Gess OTR/L,CLT  02/03/2016, 2:38 PM  [place] [facility] [address] [place], [address] Phone: [phone]   Fax:  [phone]  Name: Kristin [name] MRN: [id number] Date of Birth: [birth year]

## 2016-02-05 ENCOUNTER — Ambulatory Visit: Payer: BLUE CROSS/BLUE SHIELD | Admitting: Occupational Therapy

## 2016-02-05 ENCOUNTER — Encounter: Payer: BLUE CROSS/BLUE SHIELD | Admitting: Occupational Therapy

## 2016-02-05 DIAGNOSIS — M79642 Pain in left hand: Secondary | ICD-10-CM

## 2016-02-05 DIAGNOSIS — M25642 Stiffness of left hand, not elsewhere classified: Secondary | ICD-10-CM

## 2016-02-05 DIAGNOSIS — R208 Other disturbances of skin sensation: Secondary | ICD-10-CM | POA: Diagnosis not present

## 2016-02-05 DIAGNOSIS — M6281 Muscle weakness (generalized): Secondary | ICD-10-CM

## 2016-02-05 NOTE — Therapy (Signed)
[place] [facility] [address] [place], [address] Phone: [phone]   Fax:  [phone]  Occupational Therapy Treatment  Patient Details  Name: Kristin [name] MRN: [id number] Date of Birth: [birth year] Referring Provider: Rudene Christians  Encounter Date: 02/05/2016      OT End of Session - 02/05/16 1904    Visit Number 8   Number of Visits 10   Date for OT Re-Evaluation 02/13/16   OT Start Time 1432   OT Stop Time 1519   OT Time Calculation (min) 47 min   Activity Tolerance Patient tolerated treatment well   Behavior During Therapy [facility] for tasks assessed/performed      Past Medical History:  Diagnosis Date  . Anxiety   . Depression   . Diabetes mellitus without complication ([place])    type 1  . Elevated cholesterol   . Hypertension   . Hypothyroidism     Past Surgical History:  Procedure Laterality Date  . BREAST BIOPSY Left    bx/clip-neg  . 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 CARPAL TUNNEL RELEASE Left 11/06/2015   Procedure: CARPAL TUNNEL RELEASE;  Surgeon: Hessie Knows, MD;  Location: [facility] ORS;  Service: Orthopedics;  Laterality: Left;  . ctr Right   . HAND SURGERY Right 2010   carpal tunnel  . THYROID LOBECTOMY Left 03/2000    There were no vitals filed for this visit.      Subjective Assessment - 02/05/16 1903    Subjective  About the same- I ordered me splint online for my thumb - if I keep the bandaid on it do not trigger    Patient Stated Goals want more dexterity , strength, in L hand - and thumb not to trigger -    Currently in Pain? No/denies                      OT Treatments/Exercises (OP) - 02/05/16 0001      LUE Paraffin   Number Minutes Paraffin 10 Minutes   LUE Paraffin Location Hand   Comments at [facility] to decrease scar tissue       Graston tools for soft tissue mobs and scar mobs over CT , and scar - brushing , sweeping and  scooping with tool nr 2 and 4 - to decrease scar tissue, increase ROM and decrease pain  And over A1pulley at thumb -pt report decrease pain at A1pulley and scar improved greatly - softer  Vibration done too CT spreads done 10 x  Fitted with Oval 8 splint to decrease triggering - easy to slide on and off    Tendon glides - place and hold MC flexion with IP extention - pt was able to do it actively this date few times  Thumb PA and RA AROM , and Adduction Oppostion this date to  5th without thumb triggering - pt to cont with bandaid on IP to prevent locking during day - off 3 x for ROM   Wrist extention stretch - prayer stretch    Iontophoresis with dexamethazone done small patch and 2.0 current and 19 min over A1 pulley at thumb MP at end of session - pt ed use - skin check done prior - pt to keep it on for hour aftewards            OT Education - 02/05/16 1904    Education provided Yes  Education Details HEP   Person(s) Educated Patient   Methods Explanation;Demonstration;Tactile cues;Verbal cues   Comprehension Verbal cues required;Returned demonstration;Verbalized understanding          OT Short Term Goals - 01/12/16 1548      OT SHORT TERM GOAL #1   Title Pain on PRWHE improve by at least 10 points    Baseline at eval 17/50 on PRWHE    Time 3   Period Weeks   Status New     OT SHORT TERM GOAL #2   Title Triggering on L thumb decrease to 1 x day to use in ADL's   Baseline triggering  several times during session - tenderness over A1pulley    Time 3   Period Weeks   Status New     OT SHORT TERM GOAL #3   Title pt to be ind in HEP for scar tissue, Nerve glides and tendon glides to  have decrease pain on PRWHE    Time 3   Period Weeks   Status New           OT Long Term Goals - 01/12/16 1551      OT LONG TERM GOAL #1   Title 3 point and lat grip improve by at least 3-5 lbs to carry and hold objects with L hand    Baseline 6 lbs for L hand  and R  24 lbs    Time 4   Period Weeks   Status New     OT LONG TERM GOAL #2   Title Ulnar N healing improve for pt to show increase ROM /strength in  5th ADD, oppostion and 4th /5th lumbrical fist - and decrease triggering of thumb   Baseline able to do only place and hold ADD, unable to do opposition , and place and hold only lumbrical - increase IP flexion of thumb - and triggering    Time 4   Period Weeks   Status New     OT LONG TERM GOAL #3   Title Function score on PRWHE improve with 10 points   Baseline at eval 14/50 for PRWHE function score   Time 4   Period Weeks   Status New               Plan - 02/05/16 1905    Clinical Impression Statement Thumb still triggers but  I think  it is because positive test for ulnar N  is Froments test - fitted pt with OVal 8 splint this date to wear - scar respondiing great to Graston and vibration    Rehab Potential Good   OT Frequency 2x / week   OT Duration 2 weeks   OT Treatment/Interventions Self-care/ADL training;Fluidtherapy;Splinting;Patient/family education;Therapeutic exercises;Contrast Bath;Ultrasound;Iontophoresis;Parrafin;Therapeutic exercise;Scar mobilization;Passive range of motion;Manual Therapy   Plan Assess scar tissue , triggering -   OT Home Exercise Plan see pt instruction    Consulted and Agree with Plan of Care Patient      Patient will benefit from skilled therapeutic intervention in order to improve the following deficits and impairments:  Impaired flexibility, Decreased range of motion, Decreased coordination, Decreased scar mobility, Decreased knowledge of use of DME, Impaired UE functional use, Pain, Decreased strength  Visit Diagnosis: Other disturbances of skin sensation  Pain in left hand  Muscle weakness (generalized)  Stiffness of left hand, not elsewhere classified    Problem List Patient Active Problem List   Diagnosis Date Noted  . 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  Anxiety  01/30/2015  . Benign essential HTN 01/30/2015  . Clinical depression 01/30/2015  . Diabetes ([place]) 01/30/2015  . Esophagitis, reflux 01/30/2015  . Hypercholesteremia 01/30/2015  . Adult hypothyroidism 01/30/2015  . Hemorrhoids, internal 01/30/2015  . Anemia, iron deficiency 01/30/2015  . Extreme obesity ([place]) 01/30/2015  . Breath shortness 01/30/2015  . Apnea, sleep 01/30/2015  . 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 Avitaminosis D 01/30/2015  . Type 1 diabetes mellitus without complication ([place]) [id number]  . Acquired hypothyroidism 04/26/2014  . CC (collagenous colitis) 10/31/2007    Rosalyn Gess OTR/L,CLT 02/05/2016, 7:13 PM  [place] [facility] [address] [place], [address] Phone: [phone]   Fax:  [phone]  Name: Kristin [name] MRN: [id number] Date of Birth: [birth year]

## 2016-02-05 NOTE — Patient Instructions (Addendum)
Same HEP  

## 2016-02-10 ENCOUNTER — Ambulatory Visit: Payer: BLUE CROSS/BLUE SHIELD | Admitting: Occupational Therapy

## 2016-02-10 DIAGNOSIS — R208 Other disturbances of skin sensation: Secondary | ICD-10-CM | POA: Diagnosis not present

## 2016-02-10 DIAGNOSIS — M79642 Pain in left hand: Secondary | ICD-10-CM

## 2016-02-10 DIAGNOSIS — M25642 Stiffness of left hand, not elsewhere classified: Secondary | ICD-10-CM

## 2016-02-10 DIAGNOSIS — M6281 Muscle weakness (generalized): Secondary | ICD-10-CM

## 2016-02-10 NOTE — Patient Instructions (Addendum)
Same HEP  

## 2016-02-10 NOTE — Therapy (Signed)
[place] [facility] [address], [place], [address] Phone: [phone]   Fax:  [phone]  Occupational Therapy Treatment  Patient Details  Name: Kristin [name] MRN: [id number] Date of Birth: 03-11-1961 Referring Provider: Rudene Christians  Encounter Date: 02/10/2016      OT End of Session - 02/10/16 1354    Visit Number 9   Number of Visits 10   Date for OT Re-Evaluation 02/13/16   OT Start Time [id number]   OT Stop Time 1400   OT Time Calculation (min) 55 min   Activity Tolerance Patient tolerated treatment well   Behavior During Therapy [facility] for tasks assessed/performed      Past Medical History:  Diagnosis Date  . Anxiety   . Depression   . Diabetes mellitus without complication ([place])    type 1  . Elevated cholesterol   . Hypertension   . Hypothyroidism     Past Surgical History:  Procedure Laterality Date  . BREAST BIOPSY Left    bx/clip-neg  . 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 CARPAL TUNNEL RELEASE Left 11/06/2015   Procedure: CARPAL TUNNEL RELEASE;  Surgeon: Hessie Knows, MD;  Location: [facility] ORS;  Service: Orthopedics;  Laterality: Left;  . ctr Right   . HAND SURGERY Right 2010   carpal tunnel  . THYROID LOBECTOMY Left 03/2000    There were no vitals filed for this visit.      Subjective Assessment - 02/10/16 1351    Subjective  Ulnar Nerve is coming a long - you got my scar good last time - but still little tight here close to wrist - oval 8 splint working good    Patient Stated Goals want more dexterity , strength, in L hand - and thumb not to trigger -    Currently in Pain? No/denies                      OT Treatments/Exercises (OP) - 02/10/16 0001      LUE Paraffin   Number Minutes Paraffin 10 Minutes   LUE Paraffin Location Hand   Comments at [facility] to decrease pain and  and scar tissue     After paraffin  Graston tools for soft tissue  mobs and scar mobs over CT , and scar - brushing with  tool nr 2 and 4 - to decrease scar tissue and decrease pain  And over A1pulley at thumb -pt report decrease pain at A1pulley and scar improved greatly - softer afterwards Vibration done too CT spreads done 10 x   Oval 8 splint to decrease triggering - easy to slide on and off    Tendon glides - place and hold MC flexion with IP extention  easy  Showing increase ADD of 5th - able to place and hold    Wrist extention stretch - prayer stretch to cont at home with   Iontophoresis with dexamethazone done small patch and 2.0 current and 19 min over A1 pulley at thumb MP at end of session - pt ed use - skin check done prior - pt to keep it on for hour aftewards              OT Education - 02/10/16 1406    Education provided Yes   Education Details HEP   Person(s) Educated Patient   Methods Explanation;Demonstration;Tactile cues;Verbal cues   Comprehension Verbal cues required;Returned demonstration;Verbalized understanding  OT Short Term Goals - 01/12/16 1548      OT SHORT TERM GOAL #1   Title Pain on PRWHE improve by at least 10 points    Baseline at eval 17/50 on PRWHE    Time 3   Period Weeks   Status New     OT SHORT TERM GOAL #2   Title Triggering on L thumb decrease to 1 x day to use in ADL's   Baseline triggering  several times during session - tenderness over A1pulley    Time 3   Period Weeks   Status New     OT SHORT TERM GOAL #3   Title pt to be ind in HEP for scar tissue, Nerve glides and tendon glides to  have decrease pain on PRWHE    Time 3   Period Weeks   Status New           OT Long Term Goals - 01/12/16 1551      OT LONG TERM GOAL #1   Title 3 point and lat grip improve by at least 3-5 lbs to carry and hold objects with L hand    Baseline 6 lbs for L hand and R  24 lbs    Time 4   Period Weeks   Status New     OT LONG TERM GOAL #2   Title Ulnar N healing improve  for pt to show increase ROM /strength in  5th ADD, oppostion and 4th /5th lumbrical fist - and decrease triggering of thumb   Baseline able to do only place and hold ADD, unable to do opposition , and place and hold only lumbrical - increase IP flexion of thumb - and triggering    Time 4   Period Weeks   Status New     OT LONG TERM GOAL #3   Title Function score on PRWHE improve with 10 points   Baseline at eval 14/50 for PRWHE function score   Time 4   Period Weeks   Status New               Plan - 02/10/16 1354    Clinical Impression Statement Pt cont to show improvement in ulnar N healing - but Froments text still positive and contribute to triggering of thumb - pt wear oval 8 splint - scar improving - pt to see surgeon next week    Rehab Potential Good   OT Frequency 2x / week   OT Duration 2 weeks   OT Treatment/Interventions Self-care/ADL training;Fluidtherapy;Splinting;Patient/family education;Therapeutic exercises;Contrast Bath;Ultrasound;Iontophoresis;Parrafin;Therapeutic exercise;Scar mobilization;Passive range of motion;Manual Therapy   Plan PRWHE and grip/prehension assess   OT Home Exercise Plan see pt instruction    Consulted and Agree with Plan of Care Patient      Patient will benefit from skilled therapeutic intervention in order to improve the following deficits and impairments:  Impaired flexibility, Decreased range of motion, Decreased coordination, Decreased scar mobility, Decreased knowledge of use of DME, Impaired UE functional use, Pain, Decreased strength  Visit Diagnosis: Other disturbances of skin sensation  Pain in left hand  Muscle weakness (generalized)  Stiffness of left hand, not elsewhere classified    Problem List Patient Active Problem List   Diagnosis Date Noted  . 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 Anxiety 01/30/2015  . Benign essential HTN 01/30/2015  . Clinical depression 01/30/2015  . Diabetes ([place]) 01/30/2015  .  Esophagitis, reflux 01/30/2015  . Hypercholesteremia 01/30/2015  . Adult hypothyroidism 01/30/2015  .  Hemorrhoids, internal 01/30/2015  . Anemia, iron deficiency 01/30/2015  . Extreme obesity ([place]) 01/30/2015  . Breath shortness 01/30/2015  . Apnea, sleep 01/30/2015  . 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 Avitaminosis D 01/30/2015  . Type 1 diabetes mellitus without complication ([place]) [id number]  . Acquired hypothyroidism 04/26/2014  . CC (collagenous colitis) 10/31/2007    Rosalyn Gess OTR/L,CLT 02/10/2016, 2:10 PM  [place] [facility] [address], [place], [address] Phone: [phone]   Fax:  [phone]  Name: Kristin [name] MRN: [id number] Date of Birth: 07-Mar-1961

## 2016-02-13 ENCOUNTER — Ambulatory Visit: Payer: BLUE CROSS/BLUE SHIELD | Admitting: Occupational Therapy

## 2016-02-13 DIAGNOSIS — M25642 Stiffness of left hand, not elsewhere classified: Secondary | ICD-10-CM

## 2016-02-13 DIAGNOSIS — M6281 Muscle weakness (generalized): Secondary | ICD-10-CM

## 2016-02-13 DIAGNOSIS — R208 Other disturbances of skin sensation: Principal | ICD-10-CM

## 2016-02-13 DIAGNOSIS — M79642 Pain in left hand: Secondary | ICD-10-CM

## 2016-02-13 NOTE — Patient Instructions (Addendum)
Cont with scar management   Oval 8 splint to cont with to  decrease triggering - easy to slide on and off    Tendon glides - place and hold MC flexion with IP extention - v/c to maintain wrist extention ADD of 5th - able to place and hold   opposition of 5th still impaired - pt can attempt place and hold   Ulnar and med N reviewed - and to cont at home   Wrist extention stretch - prayer stretch to cont at home with

## 2016-02-13 NOTE — Therapy (Signed)
[place] [address] [place], [address] Phone: [phone]   Fax:  [phone]  Occupational Therapy Treatment  Patient Details  Name: Kristin [name] MRN: [id number] Date of Birth: 05-25-1960 Referring Provider: Rudene Christians  Encounter Date: 02/13/2016      OT End of Session - 02/13/16 1318    Visit Number 10   Number of Visits 10   Date for OT Re-Evaluation 02/13/16   OT Start Time 1302   OT Stop Time 1400   OT Time Calculation (min) 58 min   Activity Tolerance Patient tolerated treatment well   Behavior During Therapy [facility] for tasks assessed/performed      Past Medical History:  Diagnosis Date  . Anxiety   . Depression   . Diabetes mellitus without complication ([place])    type 1  . Elevated cholesterol   . Hypertension   . Hypothyroidism     Past Surgical History:  Procedure Laterality Date  . BREAST BIOPSY Left    bx/clip-neg  . 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 CARPAL TUNNEL RELEASE Left 11/06/2015   Procedure: CARPAL TUNNEL RELEASE;  Surgeon: Hessie Knows, MD;  Location: [facility] ORS;  Service: Orthopedics;  Laterality: Left;  . ctr Right   . HAND SURGERY Right 2010   carpal tunnel  . THYROID LOBECTOMY Left 03/2000    There were no vitals filed for this visit.      Subjective Assessment - 02/13/16 1303    Subjective  locking not as often - not really pain - just some times tender - just cannot lift something heavy with pinch grip - otherwise ulnar N coming along - no numbness    Patient Stated Goals want more dexterity , strength, in L hand - and thumb not to trigger -    Currently in Pain? No/denies            [facility] OT Assessment - 02/13/16 0001      Strength   Right Hand Grip (lbs) 74   Right Hand Lateral Pinch 24 lbs   Right Hand 3 Point Pinch 24 lbs   Left Hand Grip (lbs) 58   Left Hand Lateral Pinch 10 lbs   Left Hand 3 Point Pinch  6 lbs    Measurement for grip and prehension , and PRWHE at [employer]  See flow sheet and goals  After paraffin  [place] tools for soft tissue mobs and scar mobs over CT , and scar - brushing with  tool nr 2 and 4 - to decrease scar tissue and decrease pain  -great results - pt to cont at home  CT spreads done 10 x   Oval 8 splint to cont with to  decrease triggering - easy to slide on and off    Tendon glides - place and hold MC flexion with IP extention - v/c to maintain wrist extention Showing increase ADD of 5th - able to place and hold  But opposition of 5th still impaired - pt can attempt place and hold   Ulnar and med N reviewed - and to cont at home - needed min A for ulnar N   Wrist extention stretch - prayer stretch to cont at home with   Iontophoresis with dexamethazone done small patch and 2.0 current and 19 min over A1 pulley at thumb MP at end of session - pt ed use - skin check done prior -  pt to keep it on for hour afterwards                 OT Treatments/Exercises (OP) - 02/13/16 0001      LUE Paraffin   Number Minutes Paraffin 10 Minutes   LUE Paraffin Location Hand   Comments at [facility] to decrease scar tissue                 OT Education - 02/13/16 1318    Education provided Yes   Person(s) Educated Patient   Methods Explanation;Demonstration;Tactile cues;Verbal cues   Comprehension Verbal cues required;Returned demonstration;Verbalized understanding          OT Short Term Goals - 02/13/16 1307      OT SHORT TERM GOAL #1   Title Pain on PRWHE improve by at least 10 points    Baseline at eval 17 not 5/50    Status Achieved     OT SHORT TERM GOAL #2   Title Triggering on L thumb decrease to 1 x day to use in ADL's   Baseline triggering  several times during day still - but not as often- tenderness over A1pulley  still at times   Status Partially Met     OT SHORT TERM GOAL #3   Title pt to be ind in HEP for scar tissue, Nerve  glides and tendon glides to  have decrease pain on PRWHE    Status Achieved           OT Long Term Goals - 02/13/16 1308      OT LONG TERM GOAL #1   Title 3 point and lat grip improve by at least 3-5 lbs to carry and hold objects with L hand    Baseline 3point 6 but lateral 10 lbs - R hand 24 both    Status Partially Met     OT LONG TERM GOAL #2   Title Ulnar N healing improve for pt to show increase ROM /strength in  5th ADD, oppostion and 4th /5th lumbrical fist - and decrease triggering of thumb   Status Partially Met     OT LONG TERM GOAL #3   Title Function score on PRWHE improve with 10 points   Baseline at eval 14/50 for PRWHE function score, now 5/50    Status Partially Met               Plan - 02/13/16 1319    Clinical Impression Statement Pt showed great progress in pain , functional use , ulnar healing but still has positive Froments that contribut to trigger thumb , and not have full adduction on 5th and opposition of 5th  - sensation good - pt wear oval 8 splint to decrease pain at [place] and triggering - pt to see surgeon Wed and discuss plan    Plan Pt to see MD next week    OT Home Exercise Plan see pt instruction    Consulted and Agree with Plan of Care Patient      Patient will benefit from skilled therapeutic intervention in order to improve the following deficits and impairments:     Visit Diagnosis: Other disturbances of skin sensation  Pain in left hand  Muscle weakness (generalized)  Stiffness of left hand, not elsewhere classified    Problem List Patient Active Problem List   Diagnosis Date Noted  . 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 Anxiety 01/30/2015  . Benign essential HTN 01/30/2015  . Clinical depression 01/30/2015  .  Diabetes ([place]) 01/30/2015  . Esophagitis, reflux 01/30/2015  . Hypercholesteremia 01/30/2015  . Adult hypothyroidism 01/30/2015  . Hemorrhoids, internal 01/30/2015  . Anemia, iron deficiency 01/30/2015   . Extreme obesity ([place]) 01/30/2015  . Breath shortness 01/30/2015  . Apnea, sleep 01/30/2015  . 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 Avitaminosis D 01/30/2015  . Type 1 diabetes mellitus without complication ([place]) 14/44/5848  . Acquired hypothyroidism 04/26/2014  . CC (collagenous colitis) 10/31/2007    Rosalyn Gess OTR/L,CLT 02/13/2016, 1:54 PM  [place] [facility] [address] [place], [address] Phone: [phone]   Fax:  [phone]  Name: Kristin [name] MRN: [id number] Date of Birth: 12/06/1960

## 2016-04-23 LAB — HM DIABETES EYE EXAM

## 2016-06-25 LAB — LIPID PANEL
Cholesterol: 136 (ref 0–200)
HDL: 45 (ref 35–70)
LDL CALC: 67
Triglycerides: 123 (ref 40–160)

## 2016-07-02 DIAGNOSIS — Z6837 Body mass index (BMI) 37.0-37.9, adult: Secondary | ICD-10-CM | POA: Insufficient documentation

## 2016-10-01 LAB — TSH: TSH: 2.54 (ref 0.41–5.90)

## 2016-10-01 LAB — MICROALBUMIN, URINE: Microalb, Ur: <7

## 2016-10-01 LAB — HEMOGLOBIN A1C: HEMOGLOBIN A1C: 7.5

## 2016-10-25 ENCOUNTER — Encounter: Payer: BLUE CROSS/BLUE SHIELD | Admitting: Physician Assistant

## 2016-11-03 ENCOUNTER — Encounter: Payer: Self-pay | Admitting: Physician Assistant

## 2016-11-03 ENCOUNTER — Ambulatory Visit (INDEPENDENT_AMBULATORY_CARE_PROVIDER_SITE_OTHER): Payer: BLUE CROSS/BLUE SHIELD | Admitting: Physician Assistant

## 2016-11-03 VITALS — BP 112/68 | HR 90 | Temp 98.2°F | Ht 68.0 in | Wt 266.4 lb

## 2016-11-03 DIAGNOSIS — F419 Anxiety disorder, unspecified: Secondary | ICD-10-CM

## 2016-11-03 DIAGNOSIS — E7800 Pure hypercholesterolemia, unspecified: Secondary | ICD-10-CM | POA: Diagnosis not present

## 2016-11-03 DIAGNOSIS — Z1239 Encounter for other screening for malignant neoplasm of breast: Secondary | ICD-10-CM

## 2016-11-03 DIAGNOSIS — Z1159 Encounter for screening for other viral diseases: Secondary | ICD-10-CM

## 2016-11-03 DIAGNOSIS — Z114 Encounter for screening for human immunodeficiency virus [HIV]: Secondary | ICD-10-CM

## 2016-11-03 DIAGNOSIS — Z1211 Encounter for screening for malignant neoplasm of colon: Secondary | ICD-10-CM | POA: Diagnosis not present

## 2016-11-03 DIAGNOSIS — I10 Essential (primary) hypertension: Secondary | ICD-10-CM

## 2016-11-03 DIAGNOSIS — F3341 Major depressive disorder, recurrent, in partial remission: Secondary | ICD-10-CM

## 2016-11-03 DIAGNOSIS — Z0000 Encounter for general adult medical examination without abnormal findings: Secondary | ICD-10-CM | POA: Diagnosis not present

## 2016-11-03 DIAGNOSIS — E109 Type 1 diabetes mellitus without complications: Secondary | ICD-10-CM

## 2016-11-03 DIAGNOSIS — K52831 Collagenous colitis: Secondary | ICD-10-CM

## 2016-11-03 DIAGNOSIS — Z6841 Body Mass Index (BMI) 40.0 and over, adult: Secondary | ICD-10-CM

## 2016-11-03 DIAGNOSIS — E559 Vitamin D deficiency, unspecified: Secondary | ICD-10-CM | POA: Diagnosis not present

## 2016-11-03 DIAGNOSIS — Z1231 Encounter for screening mammogram for malignant neoplasm of breast: Secondary | ICD-10-CM

## 2016-11-03 MED ORDER — ATORVASTATIN CALCIUM 40 MG PO TABS: 40.0000 mg | ORAL_TABLET | Freq: Every day | ORAL | 1 refills | Status: DC

## 2016-11-03 MED ORDER — METOPROLOL SUCCINATE ER 25 MG PO TB24: 25.0000 mg | ORAL_TABLET | Freq: Every day | ORAL | 1 refills | Status: DC

## 2016-11-03 MED ORDER — ALPRAZOLAM 0.5 MG PO TABS: 0.5000 mg | ORAL_TABLET | Freq: Two times a day (BID) | ORAL | 1 refills | Status: DC | PRN

## 2016-11-03 MED ORDER — SERTRALINE HCL 100 MG PO TABS: 150.0000 mg | ORAL_TABLET | Freq: Every day | ORAL | 1 refills | Status: DC

## 2016-11-03 MED ORDER — LOSARTAN POTASSIUM 100 MG PO TABS: 100.0000 mg | ORAL_TABLET | Freq: Every day | ORAL | 1 refills | Status: DC

## 2016-11-03 NOTE — Progress Notes (Signed)
Patient: Kristin [name], Female    DOB: 1960/08/06, 56 y.o.   MRN: [id number] Visit Date: 11/03/2016  Today's Provider: Mar Daring, PA-C   Chief Complaint  Patient presents with  . Annual Exam   Subjective:    Annual physical exam Kristin [name] is a 56 y.o. female who presents today for health maintenance and complete physical. She feels well. She reports exercising none. She reports she is sleeping well. ----------------------------------------------------------------- Last CPE: 10/17/2015 Pap:10/17/2015- negative Mammo:11/10/2015 BI RADS 1 Colonoscopy:10/05/2011 internal hemorrhoids recheck 3-5 years; Patient has collagenous colitis and uses OTC anti-diarrheal medications to relief. She has used Entocort in the past but avoids now due to T1DM.   Review of Systems  Constitutional: Negative.   HENT: Negative.   Eyes: Negative.   Respiratory: Negative.   Cardiovascular: Negative.   Gastrointestinal: Positive for diarrhea.  Endocrine: Negative.   Genitourinary: Negative.   Musculoskeletal: Negative.   Skin: Negative.   Allergic/Immunologic: Negative.   Neurological: Negative.   Hematological: Negative.   Psychiatric/Behavioral: Negative.     Social History      She  reports that she quit smoking about 8 years ago. Her smoking use included Cigarettes. She has a 33.00 pack-year smoking history. She has never used smokeless tobacco. She reports that she drinks alcohol. She reports that she does not use drugs.       Social History   Social History  . Marital status: Widowed    Spouse name: N/A  . Number of children: 0  . Years of education: College   Social History Main Topics  . Smoking status: Former Smoker    Packs/day: 1.00    Years: 33.00    Types: Cigarettes    Quit date: 05/24/2008  . Smokeless tobacco: Never Used  . Alcohol use Yes     Comment: Occasional alcohol use  . Drug use: No  . Sexual activity: Not Asked   Other Topics Concern    . None   Social History Narrative  . None    Past Medical History:  Diagnosis Date  . Anxiety   . Depression   . Diabetes mellitus without complication ([place])    type 1  . Elevated cholesterol   . Hypertension   . Hypothyroidism      Patient Active Problem List   Diagnosis Date Noted  . Morbid obesity with BMI of 40.0-44.9, adult ([place]) 07/02/2016  . 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 Pain in joints of left hand 08/29/2015  . Anxiety 01/30/2015  . Benign essential HTN 01/30/2015  . Clinical depression 01/30/2015  . Diabetes ([place]) 01/30/2015  . Esophagitis, reflux 01/30/2015  . Hypercholesteremia 01/30/2015  . Adult hypothyroidism 01/30/2015  . Hemorrhoids, internal 01/30/2015  . Anemia, iron deficiency 01/30/2015  . Extreme obesity 01/30/2015  . Breath shortness 01/30/2015  . Apnea, sleep 01/30/2015  . 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 Avitaminosis D 01/30/2015  . Type 1 diabetes mellitus without complication ([place]) 52/77/8242  . Acquired hypothyroidism 04/26/2014  . CC (collagenous colitis) 10/31/2007    Past Surgical History:  Procedure Laterality Date  . BREAST BIOPSY Left    bx/clip-neg  . [place]   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 CARPAL TUNNEL RELEASE Left 11/06/2015   Procedure: CARPAL TUNNEL RELEASE;  Surgeon: Hessie Knows, MD;  Location: [facility] ORS;  Service: Orthopedics;  Laterality: Left;  . ctr Right   .  HAND SURGERY Right 2010   carpal tunnel  . THYROID LOBECTOMY Left 03/2000    Family History        Family Status  Relation Status  . Mother Alive  . Sister Alive  . Brother Deceased       MVA  . MGM Deceased  . MGF Deceased  . Brother Alive  . Father (Not Specified)  . Neg Hx (Not Specified)        Her family history includes Alcohol abuse in her brother, maternal grandfather, mother, and sister; Anxiety disorder in her mother and sister; Bipolar disorder in her  sister; Depression in her maternal grandmother; Healthy in her brother; Heart disease in her maternal grandfather; Hypothyroidism in her mother; Stroke in her maternal grandmother.     Allergies  Allergen Reactions  . Asacol  [Mesalamine]     Other reaction(s): Abdominal pain  . Lisinopril Cough     Current Outpatient Prescriptions:  .  ALPRAZolam (XANAX) 0.5 MG tablet, Take 1 tablet (0.5 mg total) by mouth 2 (two) times daily as needed for anxiety., Disp: 60 tablet, Rfl: 1 .  aspirin 81 MG tablet, Take 81 mg by mouth daily., Disp: , Rfl:  .  atorvastatin (LIPITOR) 40 MG tablet, Take 1 tablet (40 mg total) by mouth at bedtime., Disp: 90 tablet, Rfl: 1 .  Cholecalciferol (VITAMIN D3) 2000 units TABS, Take 2 capsules by mouth daily., Disp: , Rfl:  .  insulin aspart (NOVOLOG FLEXPEN) 100 UNIT/ML FlexPen, Inject into the skin. Sliding scale, Disp: , Rfl:  .  Insulin Glargine (LANTUS SOLOSTAR) 100 UNIT/ML Solostar Pen, Inject 44 Units into the skin daily at 10 pm. , Disp: , Rfl:  .  levothyroxine (SYNTHROID, LEVOTHROID) 200 MCG tablet, Take 200 mcg by mouth daily before breakfast., Disp: , Rfl:  .  losartan (COZAAR) 100 MG tablet, Take 1 tablet (100 mg total) by mouth daily., Disp: 90 tablet, Rfl: 1 .  metoprolol succinate (TOPROL-XL) 25 MG 24 hr tablet, Take 1 tablet (25 mg total) by mouth daily., Disp: 90 tablet, Rfl: 1 .  sertraline (ZOLOFT) 100 MG tablet, Take 1.5 tablets (150 mg total) by mouth daily., Disp: 135 tablet, Rfl: 1   Patient Care Team: Mar Daring, PA-C as PCP - General (Family Medicine)      Objective:   Vitals: BP 112/68 (BP Location: Right Arm, Patient Position: Sitting, Cuff Size: Large)   Pulse 90   Temp 98.2 F (36.8 C) (Oral)   Ht 5\' 8"  (1.727 m)   Wt 266 lb 6.4 oz (120.8 kg)   SpO2 97%   BMI 40.51 kg/m    Vitals:   11/03/16 0909  BP: 112/68  Pulse: 90  Temp: 98.2 F (36.8 C)  TempSrc: Oral  SpO2: 97%  Weight: 266 lb 6.4 oz (120.8 kg)    Height: 5\' 8"  (1.727 m)     Physical Exam  Constitutional: She is oriented to person, place, and time. She appears well-developed and well-nourished. No distress.  HENT:  Head: Normocephalic and atraumatic.  Right Ear: Hearing, tympanic membrane, external ear and ear canal normal.  Left Ear: Hearing, tympanic membrane, external ear and ear canal normal.  Nose: Nose normal.  Mouth/Throat: Uvula is midline, oropharynx is clear and moist and mucous membranes are normal. No oropharyngeal exudate.  Eyes: Conjunctivae and EOM are normal. Pupils are equal, round, and reactive to light. Right eye exhibits no discharge. Left eye exhibits no discharge. No scleral icterus.  Neck: Normal  range of motion. Neck supple. No JVD present. Carotid bruit is not present. No tracheal deviation present. No thyromegaly present.  Cardiovascular: Normal rate, regular rhythm, normal heart sounds and intact distal pulses.  Exam reveals no gallop and no friction rub.   No murmur heard. Pulmonary/Chest: Effort normal and breath sounds normal. No respiratory distress. She has no decreased breath sounds. She has no wheezes. She has no rales. She exhibits no tenderness. Right breast exhibits no inverted nipple, no mass, no nipple discharge, no skin change and no tenderness. Left breast exhibits no inverted nipple, no mass, no nipple discharge, no skin change and no tenderness. Breasts are symmetrical.  Abdominal: Soft. Bowel sounds are normal. She exhibits no distension and no mass. There is no tenderness. There is no rebound and no guarding.  Musculoskeletal: Normal range of motion. She exhibits no edema or tenderness.  Lymphadenopathy:    She has no cervical adenopathy.  Neurological: She is alert and oriented to person, place, and time.  Skin: Skin is warm and dry. No rash noted. She is not diaphoretic.  Psychiatric: She has a normal mood and affect. Her behavior is normal. Judgment and thought content normal.  Vitals  reviewed.    Depression Screen PHQ 2/9 Scores 11/03/2016  PHQ - 2 Score 0  PHQ- 9 Score 0      Assessment & Plan:     Routine Health Maintenance and Physical Exam  Exercise Activities and Dietary recommendations Goals    None      Immunization History  Administered Date(s) Administered  . H1N1 08/01/2008  . Influenza Split 03/31/2012, 04/14/2015  . Influenza,inj,Quad PF,36+ Mos 04/17/2013, 04/26/2014  . Influenza-Unspecified 02/19/2016  . Pneumococcal Polysaccharide-23 04/17/2013  . Tdap 01/14/2011  . Zoster 05/15/2013    Health Maintenance  Topic Date Due  . Hepatitis C Screening  02/01/1959  . FOOT EXAM  10/19/1968  . HIV Screening  10/19/1973  . COLONOSCOPY  10/04/2016  . INFLUENZA VACCINE  12/22/2016  . HEMOGLOBIN A1C  04/03/2017  . OPHTHALMOLOGY EXAM  04/23/2017  . MAMMOGRAM  11/09/2017  . PNEUMOCOCCAL POLYSACCHARIDE VACCINE (2) 04/17/2018  . PAP SMEAR  10/17/2018  . TETANUS/TDAP  01/13/2021     Discussed health benefits of physical activity, and encouraged her to engage in regular exercise appropriate for her age and condition.   1. Annual physical exam Normal physical exam today. Will check labs as below and f/u pending lab results. If labs are stable and WNL she will not need to have these rechecked for one year at her next annual physical exam. She is to call the office in the meantime if she has any acute issue, questions or concerns.  2. Breast cancer screening Breast exam today was normal. There is no family history of breast cancer. She does perform regular self breast exams. Mammogram was ordered as below. Information for [facility] was given to patient so she may schedule her mammogram at her convenience. - MM Digital Screening; Future  3. Colon cancer screening Due for repeat colonoscopy. Referral placed as below. Was previously done by Dr. Tyson Babinski.  - Ambulatory referral to Gastroenterology  4. Benign essential HTN Stable.  Diagnosis pulled for medication refill. Continue current medical treatment plan. Will check labs as below and f/u pending results. - CBC w/Diff/Platelet - Comprehensive Metabolic Panel (CMET) - losartan (COZAAR) 100 MG tablet; Take 1 tablet (100 mg total) by mouth daily.  Dispense: 90 tablet; Refill: 1 - metoprolol succinate (TOPROL-XL) 25 MG 24  hr tablet; Take 1 tablet (25 mg total) by mouth daily.  Dispense: 90 tablet; Refill: 1  5. Type 1 diabetes mellitus without complication (HCC) Followed by Dr. Gabriel Carina. - Comprehensive Metabolic Panel (CMET)  6. Morbid obesity with BMI of 40.0-44.9, adult ([place]) Counseled patient on healthy lifestyle modifications including dieting and exercise. Patient is doing weight watchers currently and is down 25 pounds.  - Comprehensive Metabolic Panel (CMET)  7. Collagenous colitis See above medical treatment plan for # 3.  - Ambulatory referral to Gastroenterology - Comprehensive Metabolic Panel (CMET)  8. Need for hepatitis C screening test - Hepatitis C Antibody  9. Screening for HIV without presence of risk factors - HIV antibody  10. Vitamin D deficiency H/O this. Not on supplementation. Will check labs as below and f/u pending results. - Vitamin D (25 hydroxy)  11. Recurrent major depressive disorder, in partial remission (HCC) Stable. Diagnosis pulled for medication refill. Continue current medical treatment plan. - sertraline (ZOLOFT) 100 MG tablet; Take 1.5 tablets (150 mg total) by mouth daily.  Dispense: 135 tablet; Refill: 1  12. Hypercholesteremia Stable. Diagnosis pulled for medication refill. Continue current medical treatment plan. - atorvastatin (LIPITOR) 40 MG tablet; Take 1 tablet (40 mg total) by mouth at bedtime.  Dispense: 90 tablet; Refill: 1  13. Acute anxiety Stable. Diagnosis pulled for medication refill. Continue current medical treatment plan. - ALPRAZolam (XANAX) 0.5 MG tablet; Take 1 tablet (0.5 mg total) by mouth 2  (two) times daily as needed for anxiety.  Dispense: 60 tablet; Refill: 1   --------------------------------------------------------------------    Mar Daring, PA-C  [facility]

## 2016-11-03 NOTE — Patient Instructions (Signed)
Health Maintenance for Postmenopausal Women Menopause is a normal process in which your reproductive ability comes to an end. This process happens gradually over a span of months to years, usually between the ages of 22 and 9. Menopause is complete when you have missed 12 consecutive menstrual periods. It is important to talk with your health care provider about some of the most common conditions that affect postmenopausal women, such as heart disease, cancer, and bone loss (osteoporosis). Adopting a healthy lifestyle and getting preventive care can help to promote your health and wellness. Those actions can also lower your chances of developing some of these common conditions. What should I know about menopause? During menopause, you may experience a number of symptoms, such as:  Moderate-to-severe hot flashes.  Night sweats.  Decrease in sex drive.  Mood swings.  Headaches.  Tiredness.  Irritability.  Memory problems.  Insomnia.  Choosing to treat or not to treat menopausal changes is an individual decision that you make with your health care provider. What should I know about hormone replacement therapy and supplements? Hormone therapy products are effective for treating symptoms that are associated with menopause, such as hot flashes and night sweats. Hormone replacement carries certain risks, especially as you become older. If you are thinking about using estrogen or estrogen with progestin treatments, discuss the benefits and risks with your health care provider. What should I know about heart disease and stroke? Heart disease, heart attack, and stroke become more likely as you age. This may be due, in part, to the hormonal changes that your body experiences during menopause. These can affect how your body processes dietary fats, triglycerides, and cholesterol. Heart attack and stroke are both medical emergencies. There are many things that you can do to help prevent heart disease  and stroke:  Have your blood pressure checked at least every 1-2 years. High blood pressure causes heart disease and increases the risk of stroke.  If you are 53-22 years old, ask your health care provider if you should take aspirin to prevent a heart attack or a stroke.  Do not use any tobacco products, including cigarettes, chewing tobacco, or electronic cigarettes. If you need help quitting, ask your health care provider.  It is important to eat a healthy diet and maintain a healthy weight. ? Be sure to include plenty of vegetables, fruits, low-fat dairy products, and lean protein. ? Avoid eating foods that are high in solid fats, added sugars, or salt (sodium).  Get regular exercise. This is one of the most important things that you can do for your health. ? Try to exercise for at least 150 minutes each week. The type of exercise that you do should increase your heart rate and make you sweat. This is known as moderate-intensity exercise. ? Try to do strengthening exercises at least twice each week. Do these in addition to the moderate-intensity exercise.  Know your numbers.Ask your health care provider to check your cholesterol and your blood glucose. Continue to have your blood tested as directed by your health care provider.  What should I know about cancer screening? There are several types of cancer. Take the following steps to reduce your risk and to catch any cancer development as early as possible. Breast Cancer  Practice breast self-awareness. ? This means understanding how your breasts normally appear and feel. ? It also means doing regular breast self-exams. Let your health care provider know about any changes, no matter how small.  If you are 40  or older, have a clinician do a breast exam (clinical breast exam or CBE) every year. Depending on your age, family history, and medical history, it may be recommended that you also have a yearly breast X-ray (mammogram).  If you  have a family history of breast cancer, talk with your health care provider about genetic screening.  If you are at high risk for breast cancer, talk with your health care provider about having an MRI and a mammogram every year.  Breast cancer (BRCA) gene test is recommended for women who have family members with BRCA-related cancers. Results of the assessment will determine the need for genetic counseling and BRCA1 and for BRCA2 testing. BRCA-related cancers include these types: ? Breast. This occurs in males or females. ? Ovarian. ? Tubal. This may also be called fallopian tube cancer. ? Cancer of the abdominal or pelvic lining (peritoneal cancer). ? Prostate. ? Pancreatic.  Cervical, Uterine, and Ovarian Cancer Your health care provider may recommend that you be screened regularly for cancer of the pelvic organs. These include your ovaries, uterus, and vagina. This screening involves a pelvic exam, which includes checking for microscopic changes to the surface of your cervix (Pap test).  For women ages 21-65, health care providers may recommend a pelvic exam and a Pap test every three years. For women ages 79-65, they may recommend the Pap test and pelvic exam, combined with testing for human papilloma virus (HPV), every five years. Some types of HPV increase your risk of cervical cancer. Testing for HPV may also be done on women of any age who have unclear Pap test results.  Other health care providers may not recommend any screening for nonpregnant women who are considered low risk for pelvic cancer and have no symptoms. Ask your health care provider if a screening pelvic exam is right for you.  If you have had past treatment for cervical cancer or a condition that could lead to cancer, you need Pap tests and screening for cancer for at least 20 years after your treatment. If Pap tests have been discontinued for you, your risk factors (such as having a new sexual partner) need to be  reassessed to determine if you should start having screenings again. Some women have medical problems that increase the chance of getting cervical cancer. In these cases, your health care provider may recommend that you have screening and Pap tests more often.  If you have a family history of uterine cancer or ovarian cancer, talk with your health care provider about genetic screening.  If you have vaginal bleeding after reaching menopause, tell your health care provider.  There are currently no reliable tests available to screen for ovarian cancer.  Lung Cancer Lung cancer screening is recommended for adults 69-62 years old who are at high risk for lung cancer because of a history of smoking. A yearly low-dose CT scan of the lungs is recommended if you:  Currently smoke.  Have a history of at least 30 pack-years of smoking and you currently smoke or have quit within the past 15 years. A pack-year is smoking an average of one pack of cigarettes per day for one year.  Yearly screening should:  Continue until it has been 15 years since you quit.  Stop if you develop a health problem that would prevent you from having lung cancer treatment.  Colorectal Cancer  This type of cancer can be detected and can often be prevented.  Routine colorectal cancer screening usually begins at  age 42 and continues through age 45.  If you have risk factors for colon cancer, your health care provider may recommend that you be screened at an earlier age.  If you have a family history of colorectal cancer, talk with your health care provider about genetic screening.  Your health care provider may also recommend using home test kits to check for hidden blood in your stool.  A small camera at the end of a tube can be used to examine your colon directly (sigmoidoscopy or colonoscopy). This is done to check for the earliest forms of colorectal cancer.  Direct examination of the colon should be repeated every  5-10 years until age 71. However, if early forms of precancerous polyps or small growths are found or if you have a family history or genetic risk for colorectal cancer, you may need to be screened more often.  Skin Cancer  Check your skin from head to toe regularly.  Monitor any moles. Be sure to tell your health care provider: ? About any new moles or changes in moles, especially if there is a change in a mole's shape or color. ? If you have a mole that is larger than the size of a pencil eraser.  If any of your family members has a history of skin cancer, especially at a young age, talk with your health care provider about genetic screening.  Always use sunscreen. Apply sunscreen liberally and repeatedly throughout the day.  Whenever you are outside, protect yourself by wearing long sleeves, pants, a wide-brimmed hat, and sunglasses.  What should I know about osteoporosis? Osteoporosis is a condition in which bone destruction happens more quickly than new bone creation. After menopause, you may be at an increased risk for osteoporosis. To help prevent osteoporosis or the bone fractures that can happen because of osteoporosis, the following is recommended:  If you are 46-71 years old, get at least 1,000 mg of calcium and at least 600 mg of vitamin D per day.  If you are older than age 55 but younger than age 65, get at least 1,200 mg of calcium and at least 600 mg of vitamin D per day.  If you are older than age 54, get at least 1,200 mg of calcium and at least 800 mg of vitamin D per day.  Smoking and excessive alcohol intake increase the risk of osteoporosis. Eat foods that are rich in calcium and vitamin D, and do weight-bearing exercises several times each week as directed by your health care provider. What should I know about how menopause affects my mental health? Depression may occur at any age, but it is more common as you become older. Common symptoms of depression  include:  Low or sad mood.  Changes in sleep patterns.  Changes in appetite or eating patterns.  Feeling an overall lack of motivation or enjoyment of activities that you previously enjoyed.  Frequent crying spells.  Talk with your health care provider if you think that you are experiencing depression. What should I know about immunizations? It is important that you get and maintain your immunizations. These include:  Tetanus, diphtheria, and pertussis (Tdap) booster vaccine.  Influenza every year before the flu season begins.  Pneumonia vaccine.  Shingles vaccine.  Your health care provider may also recommend other immunizations. This information is not intended to replace advice given to you by your health care provider. Make sure you discuss any questions you have with your health care provider. Document Released: 07/02/2005  Document Revised: 11/28/2015 Document Reviewed: 02/11/2015 Elsevier Interactive Patient Education  2018 [employer]  

## 2016-11-04 ENCOUNTER — Telehealth: Payer: Self-pay

## 2016-11-04 LAB — COMPREHENSIVE METABOLIC PANEL
A/G RATIO: 1.8 (ref 1.2–2.2)
ALT: 56 IU/L — AB (ref 0–32)
AST: 55 IU/L — ABNORMAL HIGH (ref 0–40)
Albumin: 4.2 g/dL (ref 3.5–5.5)
Alkaline Phosphatase: 85 IU/L (ref 39–117)
BILIRUBIN TOTAL: 0.3 mg/dL (ref 0.0–1.2)
BUN/Creatinine Ratio: 27 — ABNORMAL HIGH (ref 9–23)
BUN: 21 mg/dL (ref 6–24)
CALCIUM: 8.8 mg/dL (ref 8.7–10.2)
CHLORIDE: 98 mmol/L (ref 96–106)
CO2: 21 mmol/L (ref 20–29)
Creatinine, Ser: 0.79 mg/dL (ref 0.57–1.00)
GFR calc Af Amer: 95 mL/min/{1.73_m2} (ref >59)
GFR, EST NON AFRICAN AMERICAN: 83 mL/min/{1.73_m2} (ref >59)
GLOBULIN, TOTAL: 2.4 g/dL (ref 1.5–4.5)
Glucose: 351 mg/dL — ABNORMAL HIGH (ref 65–99)
POTASSIUM: 5.1 mmol/L (ref 3.5–5.2)
SODIUM: 136 mmol/L (ref 134–144)
Total Protein: 6.6 g/dL (ref 6.0–8.5)

## 2016-11-04 LAB — CBC WITH DIFFERENTIAL/PLATELET
BASOS: 0 %
Basophils Absolute: 0 10*3/uL (ref 0.0–0.2)
EOS (ABSOLUTE): 0.1 10*3/uL (ref 0.0–0.4)
Eos: 1 %
HEMATOCRIT: 42.5 % (ref 34.0–46.6)
Hemoglobin: 13.2 g/dL (ref 11.1–15.9)
IMMATURE GRANULOCYTES: 0 %
Immature Grans (Abs): 0 10*3/uL (ref 0.0–0.1)
LYMPHS ABS: 2 10*3/uL (ref 0.7–3.1)
Lymphs: 28 %
MCH: 29.9 pg (ref 26.6–33.0)
MCHC: 31.1 g/dL — ABNORMAL LOW (ref 31.5–35.7)
MCV: 96 fL (ref 79–97)
MONOS ABS: 0.6 10*3/uL (ref 0.1–0.9)
Monocytes: 8 %
NEUTROS ABS: 4.6 10*3/uL (ref 1.4–7.0)
Neutrophils: 63 %
PLATELETS: 211 10*3/uL (ref 150–379)
RBC: 4.41 x10E6/uL (ref 3.77–5.28)
RDW: 14 % (ref 12.3–15.4)
WBC: 7.3 10*3/uL (ref 3.4–10.8)

## 2016-11-04 LAB — HEPATITIS C ANTIBODY: Hep C Virus Ab: <0.1 s/co ratio (ref 0.0–0.9)

## 2016-11-04 LAB — VITAMIN D 25 HYDROXY (VIT D DEFICIENCY, FRACTURES): Vit D, 25-Hydroxy: 48.5 ng/mL (ref 30.0–100.0)

## 2016-11-04 LAB — HIV ANTIBODY (ROUTINE TESTING W REFLEX): HIV SCREEN 4TH GENERATION: NONREACTIVE

## 2016-11-04 NOTE — Telephone Encounter (Signed)
-----   Message from Mar Daring, PA-C sent at 11/04/2016 11:15 AM EDT ----- Sugar was up from not being a fasting lab. Liver enzymes are elevated and suspect this may be from fatty liver. If patient wishes we can get an US of the liver for further evaluation. If not wanted at this time ok for her to work on dietary changes and we can repeat LFTs in 6 months. All other labs are normal.

## 2016-11-04 NOTE — Telephone Encounter (Signed)
Patient advised. Follow up appointment and annual CPE scheduled.

## 2016-11-05 ENCOUNTER — Encounter: Payer: Self-pay | Admitting: Gastroenterology

## 2016-12-20 ENCOUNTER — Ambulatory Visit: Payer: BLUE CROSS/BLUE SHIELD | Admitting: Gastroenterology

## 2017-05-05 ENCOUNTER — Encounter: Payer: Self-pay | Admitting: Physician Assistant

## 2017-05-05 ENCOUNTER — Ambulatory Visit: Payer: Managed Care, Other (non HMO) | Admitting: Physician Assistant

## 2017-05-05 VITALS — BP 120/80 | HR 80 | Temp 98.7°F | Resp 16 | Ht 68.0 in | Wt 245.0 lb

## 2017-05-05 DIAGNOSIS — F3341 Major depressive disorder, recurrent, in partial remission: Secondary | ICD-10-CM

## 2017-05-05 DIAGNOSIS — I10 Essential (primary) hypertension: Secondary | ICD-10-CM

## 2017-05-05 DIAGNOSIS — R7989 Other specified abnormal findings of blood chemistry: Secondary | ICD-10-CM

## 2017-05-05 DIAGNOSIS — Z6837 Body mass index (BMI) 37.0-37.9, adult: Secondary | ICD-10-CM

## 2017-05-05 DIAGNOSIS — R945 Abnormal results of liver function studies: Secondary | ICD-10-CM

## 2017-05-05 DIAGNOSIS — Z1231 Encounter for screening mammogram for malignant neoplasm of breast: Secondary | ICD-10-CM | POA: Diagnosis not present

## 2017-05-05 DIAGNOSIS — Z1239 Encounter for other screening for malignant neoplasm of breast: Secondary | ICD-10-CM

## 2017-05-05 DIAGNOSIS — K52831 Collagenous colitis: Secondary | ICD-10-CM

## 2017-05-05 DIAGNOSIS — E7800 Pure hypercholesterolemia, unspecified: Secondary | ICD-10-CM

## 2017-05-05 DIAGNOSIS — Z1211 Encounter for screening for malignant neoplasm of colon: Secondary | ICD-10-CM

## 2017-05-05 MED ORDER — LOSARTAN POTASSIUM 100 MG PO TABS: 100.0000 mg | ORAL_TABLET | Freq: Every day | ORAL | 1 refills | Status: DC

## 2017-05-05 MED ORDER — METOPROLOL SUCCINATE ER 25 MG PO TB24: 25.0000 mg | ORAL_TABLET | Freq: Every day | ORAL | 1 refills | Status: DC

## 2017-05-05 MED ORDER — SERTRALINE HCL 100 MG PO TABS: 150.0000 mg | ORAL_TABLET | Freq: Every day | ORAL | 1 refills | Status: DC

## 2017-05-05 MED ORDER — ATORVASTATIN CALCIUM 40 MG PO TABS: 40.0000 mg | ORAL_TABLET | Freq: Every day | ORAL | 1 refills | Status: DC

## 2017-05-05 MED ORDER — LOPERAMIDE HCL 2 MG PO TABS: 2.0000 mg | ORAL_TABLET | Freq: Four times a day (QID) | ORAL | 0 refills | Status: DC | PRN

## 2017-05-05 NOTE — Addendum Note (Signed)
Addended by: Doristine Devoid on: 05/05/2017 09:31 AM   Modules accepted: Orders

## 2017-05-05 NOTE — Patient Instructions (Signed)
Fatty Liver Fatty liver, also called hepatic steatosis or steatohepatitis, is a condition in which too much fat has built up in your liver cells. The liver removes harmful substances from your bloodstream. It produces fluids your body needs. It also helps your body use and store energy from the food you eat. In many cases, fatty liver does not cause symptoms or problems. It is often diagnosed when tests are being done for other reasons. However, over time, fatty liver can cause inflammation that may lead to more serious liver problems, such as scarring of the liver (cirrhosis). What are the causes? Causes of fatty liver may include:  Drinking too much alcohol.  Poor nutrition.  Obesity.  Cushing syndrome.  Diabetes.  Hyperlipidemia.  Pregnancy.  Certain drugs.  Poisons.  Some viral infections.  What increases the risk? You may be more likely to develop fatty liver if you:  Abuse alcohol.  Are pregnant.  Are overweight.  Have diabetes.  Have hepatitis.  Have a high triglyceride level.  What are the signs or symptoms? Fatty liver often does not cause any symptoms. In cases where symptoms develop, they can include:  Fatigue.  Weakness.  Weight loss.  Confusion.  Abdominal pain.  Yellowing of your skin and the white parts of your eyes (jaundice).  Nausea and vomiting.  How is this diagnosed? Fatty liver may be diagnosed by:  Physical exam and medical history.  Blood tests.  Imaging tests, such as an ultrasound, CT scan, or MRI.  Liver biopsy. A small sample of liver tissue is removed using a needle. The sample is then looked at under a microscope.  How is this treated? Fatty liver is often caused by other health conditions. Treatment for fatty liver may involve medicines and lifestyle changes to manage conditions such as:  Alcoholism.  High cholesterol.  Diabetes.  Being overweight or obese.  Follow these instructions at home:  Eat a  healthy diet as directed by your health care provider.  Exercise regularly. This can help you lose weight and control your cholesterol and diabetes. Talk to your health care provider about an exercise plan and which activities are best for you.  Do not drink alcohol.  Take medicines only as directed by your health care provider. Contact a health care provider if: You have difficulty controlling your:  Blood sugar.  Cholesterol.  Alcohol consumption.  Get help right away if:  You have abdominal pain.  You have jaundice.  You have nausea and vomiting. This information is not intended to replace advice given to you by your health care provider. Make sure you discuss any questions you have with your health care provider. Document Released: 06/25/2005 Document Revised: 10/16/2015 Document Reviewed: 09/19/2013 Elsevier Interactive Patient Education  2018 Elsevier Inc.  

## 2017-05-05 NOTE — Progress Notes (Signed)
Patient: Kristin [name] Female    DOB: 01/21/1961   56 y.o.   MRN: [id number] Visit Date: 05/05/2017  Today's Provider: Mar Daring, PA-C   Chief Complaint  Patient presents with  . Hypertension  . Anxiety  . Depression   Subjective:    HPI   Hypertension, follow-up:  BP Readings from Last 3 Encounters:  05/05/17 120/80  11/03/16 112/68  11/06/15 (!) 119/56    She was last seen for hypertension 6 months ago.  BP at that visit was 112/6/. Management since that visit includes none. She reports excellent compliance with treatment. She is not having side effects.  She is not exercising. She is adherent to low salt diet.   Outside blood pressures are not being checked. She is experiencing lightheaded.  Patient denies chest pain, irregular heart beat and lower extremity edema.   Cardiovascular risk factors include hypertension and obesity (BMI >= 30 kg/m2).  Use of agents associated with hypertension: none.    Weight trend: stable Wt Readings from Last 3 Encounters:  05/05/17 245 lb (111.1 kg)  11/03/16 266 lb 6.4 oz (120.8 kg)  11/06/15 285 lb (129.3 kg)   Current diet: in general, a "healthy" diet    ------------------------------------------------------------------------ Depression and Anxiety, follow up: Patient is here for 6 months follow-up depression. Patient reports good tolerance and compliance with medications. Patient reports taking Xanax 1-2 times a month.   Patient also following up elevated liver enzymes. She has lost 20 pounds since last being seen by doing weight watchers and feels well.     Allergies  Allergen Reactions  . Asacol  [Mesalamine]     Other reaction(s): Abdominal pain  . Lisinopril Cough     Current Outpatient Medications:  .  ALPRAZolam (XANAX) 0.5 MG tablet, Take 1 tablet (0.5 mg total) by mouth 2 (two) times daily as needed for anxiety., Disp: 60 tablet, Rfl: 1 .  aspirin 81 MG tablet, Take 81 mg by mouth  daily., Disp: , Rfl:  .  atorvastatin (LIPITOR) 40 MG tablet, Take 1 tablet (40 mg total) by mouth at bedtime., Disp: 90 tablet, Rfl: 1 .  Cholecalciferol (VITAMIN D3) 2000 units TABS, Take 2 capsules by mouth daily., Disp: , Rfl:  .  Insulin Glargine (LANTUS SOLOSTAR) 100 UNIT/ML Solostar Pen, Inject 44 Units into the skin daily at 10 pm. , Disp: , Rfl:  .  Insulin Lispro (HUMALOG KWIKPEN [place]), Inject into the skin., Disp: , Rfl:  .  levothyroxine (SYNTHROID, LEVOTHROID) 200 MCG tablet, Take 200 mcg by mouth daily before breakfast., Disp: , Rfl:  .  losartan (COZAAR) 100 MG tablet, Take 1 tablet (100 mg total) by mouth daily., Disp: 90 tablet, Rfl: 1 .  metoprolol succinate (TOPROL-XL) 25 MG 24 hr tablet, Take 1 tablet (25 mg total) by mouth daily., Disp: 90 tablet, Rfl: 1 .  sertraline (ZOLOFT) 100 MG tablet, Take 1.5 tablets (150 mg total) by mouth daily., Disp: 135 tablet, Rfl: 1  Review of Systems  Constitutional: Negative.   HENT: Negative.   Respiratory: Negative.   Cardiovascular: Negative.   Psychiatric/Behavioral: Negative.     Social History   Tobacco Use  . Smoking status: Former Smoker    Packs/day: 1.00    Years: 33.00    Pack years: 33.00    Types: Cigarettes    Last attempt to quit: 05/24/2008    Years since quitting: 8.9  . Smokeless tobacco: Never Used  Substance Use Topics  .  Alcohol use: Yes    Comment: Occasional alcohol use   Objective:   BP 120/80 (BP Location: Left Arm, Patient Position: Sitting, Cuff Size: Large)   Pulse 80   Temp 98.7 F (37.1 C) (Oral)   Resp 16   Ht 5\' 8"  (1.727 m)   Wt 245 lb (111.1 kg)   SpO2 97%   BMI 37.25 kg/m     Physical Exam  Constitutional: She appears well-developed and well-nourished. No distress.  Neck: Normal range of motion. Neck supple. No tracheal deviation present.  Cardiovascular: Normal rate, regular rhythm and normal heart sounds. Exam reveals no gallop and no friction rub.  No murmur  heard. Pulmonary/Chest: Effort normal and breath sounds normal. No respiratory distress. She has no wheezes. She has no rales.  Lymphadenopathy:    She has no cervical adenopathy.  Skin: She is not diaphoretic.  Psychiatric: She has a normal mood and affect. Her behavior is normal. Judgment and thought content normal.  Vitals reviewed.     Assessment & Plan:     1. Benign essential HTN Stable. Diagnosis pulled for medication refill. Continue current medical treatment plan. - losartan (COZAAR) 100 MG tablet; Take 1 tablet (100 mg total) by mouth daily.  Dispense: 90 tablet; Refill: 1 - metoprolol succinate (TOPROL-XL) 25 MG 24 hr tablet; Take 1 tablet (25 mg total) by mouth daily.  Dispense: 90 tablet; Refill: 1  2. Recurrent major depressive disorder, in partial remission (HCC) Stable. Diagnosis pulled for medication refill. Continue current medical treatment plan. - sertraline (ZOLOFT) 100 MG tablet; Take 1.5 tablets (150 mg total) by mouth daily.  Dispense: 135 tablet; Refill: 1  3. Hypercholesteremia Stable. Diagnosis pulled for medication refill. Continue current medical treatment plan. - atorvastatin (LIPITOR) 40 MG tablet; Take 1 tablet (40 mg total) by mouth at bedtime.  Dispense: 90 tablet; Refill: 1  4. Elevated LFTs Will recheck labs today. Hope to see some improvements with dietary changes.  - Ambulatory referral to Gastroenterology - Hepatic function panel  5. Breast cancer screening Lost job shortly after CPE this year and recently got a new job. Now wanting to schedule and go forth with mammogram. [place] card given to patient so she can call to schedule.  - MM DIGITAL SCREENING BILATERAL; Future  6. Colon cancer screening Patient does have collagenous colitis. Due for screening colonoscopy. Referral to GI placed as below.  - Ambulatory referral to Gastroenterology  7. Collagenous colitis Stable currently using 1-2 OTC imodium daily.  - Ambulatory referral to  Gastroenterology - loperamide (IMODIUM A-D) 2 MG tablet; Take 1 tablet (2 mg total) by mouth 4 (four) times daily as needed for diarrhea or loose stools.  Dispense: 30 tablet; Refill: 0  8. BMI 37.0-37.9, adult Doing well with weight loss. Has lost 20 pounds since June of this year and 40 pounds total since starting. She is using weight watchers.       Mar Daring, PA-C  [facility]

## 2017-05-06 ENCOUNTER — Telehealth: Payer: Self-pay

## 2017-05-06 LAB — HEPATIC FUNCTION PANEL
ALT: 25 IU/L (ref 0–32)
AST: 18 IU/L (ref 0–40)
Albumin: 4.2 g/dL (ref 3.5–5.5)
Alkaline Phosphatase: 66 IU/L (ref 39–117)
BILIRUBIN, DIRECT: 0.13 mg/dL (ref 0.00–0.40)
Bilirubin Total: 0.4 mg/dL (ref 0.0–1.2)
TOTAL PROTEIN: 6.3 g/dL (ref 6.0–8.5)

## 2017-05-06 NOTE — Telephone Encounter (Signed)
LMTCB with any questions or concerns. Patient is also able to view the results through my chart.  Thanks,  -Joseline

## 2017-05-06 NOTE — Telephone Encounter (Signed)
-----   Message from Mar Daring, [place] sent at 05/06/2017  8:18 AM EST ----- Liver enzymes are now WNL!!! Keep up the good work!

## 2017-06-06 ENCOUNTER — Ambulatory Visit
Admission: RE | Admit: 2017-06-06 | Discharge: 2017-06-06 | Disposition: A | Discharge status: Home / Self Care | Payer: Managed Care, Other (non HMO) | Source: Ambulatory Visit | Attending: Physician Assistant | Admitting: Physician Assistant

## 2017-06-06 DIAGNOSIS — Z1231 Encounter for screening mammogram for malignant neoplasm of breast: Secondary | ICD-10-CM | POA: Diagnosis not present

## 2017-06-06 DIAGNOSIS — Z1239 Encounter for other screening for malignant neoplasm of breast: Secondary | ICD-10-CM

## 2017-06-07 ENCOUNTER — Telehealth: Payer: Self-pay

## 2017-06-07 ENCOUNTER — Encounter: Payer: Self-pay | Admitting: Gastroenterology

## 2017-06-07 ENCOUNTER — Ambulatory Visit: Payer: BLUE CROSS/BLUE SHIELD | Admitting: Gastroenterology

## 2017-06-07 VITALS — BP 112/75 | HR 77 | Temp 98.0°F | Ht 68.0 in | Wt 245.8 lb

## 2017-06-07 DIAGNOSIS — Z08 Encounter for follow-up examination after completed treatment for malignant neoplasm: Secondary | ICD-10-CM | POA: Diagnosis not present

## 2017-06-07 DIAGNOSIS — K52831 Collagenous colitis: Secondary | ICD-10-CM

## 2017-06-07 DIAGNOSIS — Z85038 Personal history of other malignant neoplasm of large intestine: Secondary | ICD-10-CM | POA: Diagnosis not present

## 2017-06-07 NOTE — Telephone Encounter (Signed)
No answer  Thanks,  -Joseline 

## 2017-06-07 NOTE — Progress Notes (Signed)
Cephas Darby, MD [address]  [place]  [place], [place] [address]  Main: [phone]  Fax: [phone]    Gastroenterology Consultation  Referring Provider:     Florian Buff* Primary Care Physician:  Mar Daring, PA-C Primary Gastroenterologist:  Dr. Cephas Darby Reason for Consultation:     Surveillance for colon cancer screening, collagenous colitis        HPI:   Kristin [name] is a 57 y.o. female referred by Dr. Marlyn Corporal, Clearnce Sorrel, PA-C  for consultation & management of collagenous colitis and to discuss about colonoscopy. She is relatively healthy and denies any GI complaints today. She is diagnosed with collagenous colitis after colonoscopy approximately 10 years ago. Was initially on Entocort which worked. Later discontinued by Dr. Leta Speller and she stayed on Imodium one to 2 pills daily. Currently, having one to 2 formed bowel movements per day. She denies rectal bleeding or altered bowel habits. She denies upper GI symptoms. Her blood work looks unremarkable. She is watching [employer] and lost about 42 pounds in last 1 year. She had her last colonoscopy in 2013, no report available. Patient however reports that there were no polyps identified. She had transiently elevated mild transaminases which resolved. Hep C antibody negative.  NSAIDs: None  Antiplts/Anticoagulants/Anti thrombotics: None  GI Procedures: Colonoscopy 2013 Internal hemorrhoids, collagenous colitis per patient's report  She did not have any GI surgeries She denies family history of GI conditions including malignancy  Past Medical History:  Diagnosis Date  . Anxiety   . Depression   . Diabetes mellitus without complication ([place])    type 1  . Elevated cholesterol   . Hypertension   . Hypothyroidism     Past Surgical History:  Procedure Laterality Date  . BREAST BIOPSY Left    bx/clip-neg  . 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 not  replaced  . CARPAL TUNNEL RELEASE Left 11/06/2015   Procedure: CARPAL TUNNEL RELEASE;  Surgeon: Hessie Knows, MD;  Location: [facility] ORS;  Service: Orthopedics;  Laterality: Left;  . ctr Right   . HAND SURGERY Right 2010   carpal tunnel  . THYROID LOBECTOMY Left 03/2000     Current Outpatient Medications:  .  ALPRAZolam (XANAX) 0.5 MG tablet, Take 1 tablet (0.5 mg total) by mouth 2 (two) times daily as needed for anxiety., Disp: 60 tablet, Rfl: 1 .  aspirin 81 MG tablet, Take 81 mg by mouth daily., Disp: , Rfl:  .  atorvastatin (LIPITOR) 40 MG tablet, Take 1 tablet (40 mg total) by mouth at bedtime., Disp: 90 tablet, Rfl: 1 .  Cholecalciferol (VITAMIN D3) 2000 units TABS, Take 2 capsules by mouth daily., Disp: , Rfl:  .  Insulin Glargine (LANTUS SOLOSTAR) 100 UNIT/ML Solostar Pen, Inject 44 Units into the skin daily at 10 pm. , Disp: , Rfl:  .  Insulin Lispro (HUMALOG KWIKPEN [place]), Inject into the skin., Disp: , Rfl:  .  levothyroxine (SYNTHROID, LEVOTHROID) 200 MCG tablet, Take 200 mcg by mouth daily before breakfast., Disp: , Rfl:  .  loperamide (IMODIUM A-D) 2 MG tablet, Take 1 tablet (2 mg total) by mouth 4 (four) times daily as needed for diarrhea or loose stools., Disp: 30 tablet, Rfl: 0 .  losartan (COZAAR) 100 MG tablet, Take 1 tablet (100 mg total) by mouth daily., Disp: 90 tablet, Rfl: 1 .  metoprolol succinate (TOPROL-XL) 25 MG 24 hr tablet, Take 1 tablet (25  mg total) by mouth daily., Disp: 90 tablet, Rfl: 1 .  sertraline (ZOLOFT) 100 MG tablet, Take 1.5 tablets (150 mg total) by mouth daily., Disp: 135 tablet, Rfl: 1   Family History  Problem Relation Age of Onset  . Alcohol abuse Mother   . Anxiety disorder Mother   . Hypothyroidism Mother   . Alcohol abuse Sister   . Anxiety disorder Sister   . Bipolar disorder Sister   . Alcohol abuse Brother   . Depression Maternal Grandmother   . Stroke Maternal Grandmother   . Alcohol abuse Maternal Grandfather   . Heart disease  Maternal Grandfather   . Healthy Brother   . Breast cancer Neg Hx      Social History   Tobacco Use  . Smoking status: Former Smoker    Packs/day: 1.00    Years: 33.00    Pack years: 33.00    Types: Cigarettes    Last attempt to quit: 05/24/2008    Years since quitting: 9.0  . Smokeless tobacco: Never Used  Substance Use Topics  . Alcohol use: Yes    Comment: Occasional alcohol use  . Drug use: No    Allergies as of 06/07/2017 - Review Complete 06/07/2017  Allergen Reaction Noted  . Asacol  [mesalamine]  01/30/2015  . Lisinopril Cough 01/30/2015    Review of Systems:    All systems reviewed and negative except where noted in HPI.   Physical Exam:  BP 112/75   Pulse 77   Temp 98 F (36.7 C) (Oral)   Ht 5\' 8"  (1.727 m)   Wt 245 lb 12.8 oz (111.5 kg)   BMI 37.37 kg/m  No LMP recorded. Patient is postmenopausal.  General:   Alert,  Well-developed, well-nourished, pleasant and cooperative in NAD Head:  Normocephalic and atraumatic. Eyes:  Sclera clear, no icterus.   Conjunctiva pink. Ears:  Normal auditory acuity. Nose:  No deformity, discharge, or lesions. Mouth:  No deformity or lesions,oropharynx pink & moist. Neck:  Supple; no masses or thyromegaly. Lungs:  Respirations even and unlabored.  Clear throughout to auscultation.   No wheezes, crackles, or rhonchi. No acute distress. Heart:  Regular rate and rhythm; no murmurs, clicks, rubs, or gallops. Abdomen:  Normal bowel sounds. Soft, non-tender and non-distended without masses, hepatosplenomegaly or hernias noted.  No guarding or rebound tenderness.   Rectal: Not performed Msk:  Symmetrical without gross deformities. Good, equal movement & strength bilaterally. Pulses:  Normal pulses noted. Extremities:  No clubbing or edema.  No cyanosis. Neurologic:  Alert and oriented x3;  grossly normal neurologically. Skin:  Intact without significant lesions or rashes. No jaundice. Lymph Nodes:  No significant cervical  adenopathy. Psych:  Alert and cooperative. Normal mood and affect.  Imaging Studies: No abdominal imaging  Assessment and Plan:   Kristin [name] is a 57 y.o. White female with history of obesity, metabolic syndrome, collagenous colitis presents to discuss about surveillance colonoscopy.  Collagenous colitis: Currently in remission on Imodium daily  Colon cancer surveillance: Patient would like to obtain previous colonoscopy report before undergoing surveillance colonoscopy She will contact our office about the colonoscopy report and will go from there   Follow up as needed   Cephas Darby, MD

## 2017-06-07 NOTE — Telephone Encounter (Signed)
Patient advised.

## 2017-06-07 NOTE — Telephone Encounter (Signed)
-----   Message from Mar Daring, [place] sent at 06/07/2017 11:25 AM EST ----- Normal mammogram. Repeat screening in one year.

## 2017-06-13 ENCOUNTER — Other Ambulatory Visit: Payer: Self-pay

## 2017-06-13 DIAGNOSIS — Z1211 Encounter for screening for malignant neoplasm of colon: Secondary | ICD-10-CM

## 2017-07-14 ENCOUNTER — Ambulatory Visit
Admission: RE | Admit: 2017-07-14 | Discharge: 2017-07-14 | Disposition: A | Discharge status: Home / Self Care | Payer: Managed Care, Other (non HMO) | Source: Ambulatory Visit | Attending: Gastroenterology | Admitting: Gastroenterology

## 2017-07-14 ENCOUNTER — Encounter
Admission: RE | Disposition: A | Discharge status: Home / Self Care | Payer: Self-pay | Source: Ambulatory Visit | Attending: Gastroenterology

## 2017-07-14 ENCOUNTER — Encounter: Payer: Self-pay | Admitting: Certified Registered Nurse Anesthetist

## 2017-07-14 ENCOUNTER — Ambulatory Visit: Payer: Managed Care, Other (non HMO) | Admitting: Anesthesiology

## 2017-07-14 DIAGNOSIS — G4730 Sleep apnea, unspecified: Secondary | ICD-10-CM | POA: Diagnosis not present

## 2017-07-14 DIAGNOSIS — I10 Essential (primary) hypertension: Secondary | ICD-10-CM | POA: Diagnosis not present

## 2017-07-14 DIAGNOSIS — Z794 Long term (current) use of insulin: Secondary | ICD-10-CM | POA: Diagnosis not present

## 2017-07-14 DIAGNOSIS — F329 Major depressive disorder, single episode, unspecified: Secondary | ICD-10-CM | POA: Insufficient documentation

## 2017-07-14 DIAGNOSIS — Z7982 Long term (current) use of aspirin: Secondary | ICD-10-CM | POA: Diagnosis not present

## 2017-07-14 DIAGNOSIS — F419 Anxiety disorder, unspecified: Secondary | ICD-10-CM | POA: Insufficient documentation

## 2017-07-14 DIAGNOSIS — D124 Benign neoplasm of descending colon: Secondary | ICD-10-CM

## 2017-07-14 DIAGNOSIS — Z888 Allergy status to other drugs, medicaments and biological substances status: Secondary | ICD-10-CM | POA: Diagnosis not present

## 2017-07-14 DIAGNOSIS — E039 Hypothyroidism, unspecified: Secondary | ICD-10-CM | POA: Diagnosis not present

## 2017-07-14 DIAGNOSIS — Z1211 Encounter for screening for malignant neoplasm of colon: Secondary | ICD-10-CM | POA: Diagnosis not present

## 2017-07-14 DIAGNOSIS — Z87891 Personal history of nicotine dependence: Secondary | ICD-10-CM | POA: Diagnosis not present

## 2017-07-14 DIAGNOSIS — Z860100 Personal history of colon polyps, unspecified: Secondary | ICD-10-CM

## 2017-07-14 DIAGNOSIS — K529 Noninfective gastroenteritis and colitis, unspecified: Secondary | ICD-10-CM | POA: Diagnosis not present

## 2017-07-14 DIAGNOSIS — E119 Type 2 diabetes mellitus without complications: Secondary | ICD-10-CM | POA: Diagnosis not present

## 2017-07-14 DIAGNOSIS — Z79899 Other long term (current) drug therapy: Secondary | ICD-10-CM | POA: Diagnosis not present

## 2017-07-14 DIAGNOSIS — E7800 Pure hypercholesterolemia, unspecified: Secondary | ICD-10-CM | POA: Diagnosis not present

## 2017-07-14 DIAGNOSIS — D123 Benign neoplasm of transverse colon: Secondary | ICD-10-CM | POA: Diagnosis not present

## 2017-07-14 HISTORY — PX: COLONOSCOPY WITH PROPOFOL: SHX5780

## 2017-07-14 LAB — GLUCOSE, CAPILLARY: Glucose-Capillary: 191 mg/dL — ABNORMAL HIGH (ref 65–99)

## 2017-07-14 SURGERY — COLONOSCOPY WITH PROPOFOL
Anesthesia: General

## 2017-07-14 MED ORDER — LIDOCAINE HCL (PF) 2 % IJ SOLN
INTRAMUSCULAR | Status: AC
  Filled 2017-07-14: qty 10

## 2017-07-14 MED ORDER — PROPOFOL 10 MG/ML IV BOLUS
INTRAVENOUS | Status: AC
  Filled 2017-07-14: qty 20

## 2017-07-14 MED ORDER — SODIUM CHLORIDE 0.9 % IV SOLN
INTRAVENOUS | Status: DC
  Administered 2017-07-14: 1000 mL via INTRAVENOUS

## 2017-07-14 MED ORDER — PROPOFOL 10 MG/ML IV BOLUS
INTRAVENOUS | Status: DC | PRN
  Administered 2017-07-14: 40 mg via INTRAVENOUS
  Administered 2017-07-14: 50 mg via INTRAVENOUS
  Administered 2017-07-14: 90 mg via INTRAVENOUS

## 2017-07-14 MED ORDER — PROPOFOL 500 MG/50ML IV EMUL
INTRAVENOUS | Status: DC | PRN
  Administered 2017-07-14: 125 ug/kg/min via INTRAVENOUS

## 2017-07-14 MED ORDER — LIDOCAINE HCL (CARDIAC) 20 MG/ML IV SOLN
INTRAVENOUS | Status: DC | PRN
  Administered 2017-07-14: 30 mg via INTRAVENOUS

## 2017-07-14 NOTE — Anesthesia Post-op Follow-up Note (Signed)
Anesthesia QCDR form completed.        

## 2017-07-14 NOTE — Anesthesia Preprocedure Evaluation (Signed)
Anesthesia Evaluation  Patient identified by MRN, date of birth, ID band Patient awake    Reviewed: Allergy & Precautions, NPO status , Patient's Chart, lab work & pertinent test results  History of Anesthesia Complications Negative for: history of anesthetic complications  Airway Mallampati: II       Dental   Pulmonary sleep apnea (not had sleep study yet) , neg COPD, former smoker,           Cardiovascular hypertension, Pt. on medications and Pt. on home beta blockers + Valvular Problems/Murmurs (? aortic valve repair at 57 yo, no problems since)      Neuro/Psych neg Seizures Anxiety Depression    GI/Hepatic Neg liver ROS, neg GERD  ,  Endo/Other  diabetes, Type 2, Insulin DependentHypothyroidism   Renal/GU negative Renal ROS     Musculoskeletal   Abdominal   Peds  Hematology  (+) anemia ,   Anesthesia Other Findings   Reproductive/Obstetrics                             Anesthesia Physical Anesthesia Plan  ASA: III  Anesthesia Plan: General   Post-op Pain Management:    Induction:   PONV Risk Score and Plan: 2 and TIVA and Propofol infusion  Airway Management Planned: Nasal Cannula  Additional Equipment:   Intra-op Plan:   Post-operative Plan:   Informed Consent: I have reviewed the patients History and Physical, chart, labs and discussed the procedure including the risks, benefits and alternatives for the proposed anesthesia with the patient or authorized representative who has indicated his/her understanding and acceptance.     Plan Discussed with:   Anesthesia Plan Comments:         Anesthesia Quick Evaluation

## 2017-07-14 NOTE — Transfer of Care (Signed)
Immediate Anesthesia Transfer of Care Note  Patient: Kristin [name]  Procedure(s) Performed: COLONOSCOPY WITH PROPOFOL (N/A )  Patient Location: PACU and Endoscopy Unit  Anesthesia Type:General  Level of Consciousness: awake  Airway & Oxygen Therapy: Patient Spontanous Breathing  Post-op Assessment: Report given to RN  Post vital signs: stable  Last Vitals:  Vitals:   07/14/17 0805  BP: (!) 141/83  Pulse: (!) 106  Resp: 20  Temp: (!) 36.3 C  SpO2: 97%    Last Pain:  Vitals:   07/14/17 0805  TempSrc: Tympanic         Complications: No apparent anesthesia complications

## 2017-07-14 NOTE — Anesthesia Postprocedure Evaluation (Signed)
Anesthesia Post Note  Patient: Kristin [name]  Procedure(s) Performed: COLONOSCOPY WITH PROPOFOL (N/A )  Patient location during evaluation: Endoscopy Anesthesia Type: General Level of consciousness: awake and alert Pain management: pain level controlled Vital Signs Assessment: post-procedure vital signs reviewed and stable Respiratory status: spontaneous breathing and respiratory function stable Cardiovascular status: stable Anesthetic complications: no     Last Vitals:  Vitals:   07/14/17 0927 07/14/17 0938  BP: 108/73 120/68  Pulse: 74 71  Resp: 15 17  Temp:    SpO2: 98% 99%    Last Pain:  Vitals:   07/14/17 0805  TempSrc: Tympanic                 KEPHART,WILLIAM K

## 2017-07-14 NOTE — Op Note (Signed)
[facility] Gastroenterology Patient Name: Kristin [name] Procedure Date: 07/14/2017 8:48 AM MRN: [id number] Account #: [id number] Date of Birth: September 18, 1960 Admit Type: Outpatient Age: 57 Room: [facility] ENDO ROOM 4 Gender: Female Note Status: Finalized Procedure:            Colonoscopy Indications:          Surveillance: Personal history of colonic polyps                        (unknown histology) on last colonoscopy 5 years ago,                        Last colonoscopy: June 2009 Providers:            Lin Landsman MD, MD Referring MD:         Mar Daring (Referring MD) Medicines:            Monitored Anesthesia Care Complications:        No immediate complications. Estimated blood loss:                        Minimal. Procedure:            Pre-Anesthesia Assessment:                       - Prior to the procedure, a History and Physical was                        performed, and patient medications and allergies were                        reviewed. The patient is competent. The risks and                        benefits of the procedure and the sedation options and                        risks were discussed with the patient. All questions                        were answered and informed consent was obtained.                        Patient identification and proposed procedure were                        verified by the physician, the nurse, the                        anesthesiologist, the anesthetist and the technician in                        the pre-procedure area in the procedure room in the                        endoscopy suite. Mental Status Examination: alert and                        oriented. Airway Examination: normal oropharyngeal  airway and neck mobility. Respiratory Examination:                        clear to auscultation. CV Examination: normal.                        Prophylactic Antibiotics: The patient does  not require                        prophylactic antibiotics. Prior Anticoagulants: The                        patient has taken aspirin, last dose was 1 day prior to                        procedure. ASA Grade Assessment: II - A patient with                        mild systemic disease. After reviewing the risks and                        benefits, the patient was deemed in satisfactory                        condition to undergo the procedure. The anesthesia plan                        was to use monitored anesthesia care (MAC). Immediately                        prior to administration of medications, the patient was                        re-assessed for adequacy to receive sedatives. The                        heart rate, respiratory rate, oxygen saturations, blood                        pressure, adequacy of pulmonary ventilation, and                        response to care were monitored throughout the                        procedure. The physical status of the patient was                        re-assessed after the procedure.                       After obtaining informed consent, the colonoscope was                        passed under direct vision. Throughout the procedure,                        the patient's blood pressure, pulse, and oxygen  saturations were monitored continuously. The                        Colonoscope was introduced through the anus and                        advanced to the the cecum, identified by appendiceal                        orifice and ileocecal valve. The colonoscopy was                        performed without difficulty. The patient tolerated the                        procedure well. The quality of the bowel preparation                        was evaluated using the BBPS [facility] Bowel Preparation                        Scale) with scores of: Right Colon = 3, Transverse                        Colon = 3 and Left Colon = 3  (entire mucosa seen well                        with no residual staining, small fragments of stool or                        opaque liquid). The total BBPS score equals 9. Findings:      The perianal and digital rectal examinations were normal. Pertinent       negatives include normal sphincter tone and no palpable rectal lesions.      Two sessile polyps were found in the descending colon and transverse       colon. The polyps were 6 mm in size. These polyps were removed with a       cold snare. Resection and retrieval were complete.      Normal mucosa was found in the entire colon. Biopsies for histology were       taken with a cold forceps from the entire colon for evaluation of       microscopic colitis.      The retroflexed view of the distal rectum and anal verge was normal and       showed no anal or rectal abnormalities. Impression:           - Two 6 mm polyps in the descending colon and in the                        transverse colon, removed with a cold snare. Resected                        and retrieved.                       - Normal mucosa in the entire examined colon. Biopsied.                       -  The distal rectum and anal verge are normal on                        retroflexion view. Recommendation:       - Discharge patient to home.                       - Resume previous diet today.                       - Continue present medications.                       - Await pathology results.                       - Repeat colonoscopy in 5 years for surveillance. Procedure Code(s):    --- Professional ---                       [phone], Colonoscopy, flexible; with removal of tumor(s),                        polyp(s), or other lesion(s) by snare technique                       [id number], 62, Colonoscopy, flexible; with biopsy, single                        or multiple Diagnosis Code(s):    --- Professional ---                       Z86.010, Personal history of colonic polyps                        D12.4, Benign neoplasm of descending colon                       D12.3, Benign neoplasm of transverse colon (hepatic                        flexure or splenic flexure) CPT copyright 2016 American Medical Association. All rights reserved. The codes documented in this report are preliminary and upon coder review may  be revised to meet current compliance requirements. Dr. Ulyess Mort Lin Landsman MD, MD 07/14/2017 9:14:11 AM This report has been signed electronically. Number of Addenda: 0 Note Initiated On: 07/14/2017 8:48 AM Scope Withdrawal Time: 0 hours 9 minutes 26 seconds  Total Procedure Duration: 0 hours 16 minutes 46 seconds       [facility]

## 2017-07-14 NOTE — H&P (Signed)
Cephas Darby, MD [address]  [place]  [place], [place] [address]  Main: [phone]  Fax: [phone] Pager: [phone]  Primary Care Physician:  Mar Daring, PA-C Primary Gastroenterologist:  Dr. Cephas Darby  Pre-Procedure History & Physical: HPI:  Kristin [name] is a 57 y.o. female is here for an colonoscopy.   Past Medical History:  Diagnosis Date  . Anxiety   . Depression   . Diabetes mellitus without complication ([place])    type 1  . Elevated cholesterol   . Hypertension   . Hypothyroidism     Past Surgical History:  Procedure Laterality Date  . BREAST BIOPSY Left    bx/clip-neg  . [place]   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 CARPAL TUNNEL RELEASE Left 11/06/2015   Procedure: CARPAL TUNNEL RELEASE;  Surgeon: Hessie Knows, MD;  Location: [facility] ORS;  Service: Orthopedics;  Laterality: Left;  . ctr Right   . HAND SURGERY Right 2010   carpal tunnel  . THYROID LOBECTOMY Left 03/2000    Prior to Admission medications   Medication Sig Start Date End Date Taking? Authorizing Provider  ALPRAZolam Duanne Moron) 0.5 MG tablet Take 1 tablet (0.5 mg total) by mouth 2 (two) times daily as needed for anxiety. 11/03/16  Yes Mar Daring, PA-C  aspirin 81 MG tablet Take 81 mg by mouth daily.   Yes [provider]  atorvastatin (LIPITOR) 40 MG tablet Take 1 tablet (40 mg total) by mouth at bedtime. 05/05/17  Yes Mar Daring, PA-C  Cholecalciferol (VITAMIN D3) 2000 units TABS Take 2 capsules by mouth daily.   Yes [provider]  Insulin Glargine (LANTUS SOLOSTAR) 100 UNIT/ML Solostar Pen Inject 44 Units into the skin daily at 10 pm.    Yes [provider]  Insulin Lispro (HUMALOG KWIKPEN [place]) Inject into the skin.   Yes [provider]  levothyroxine (SYNTHROID, LEVOTHROID) 200 MCG tablet Take 200 mcg by mouth daily before breakfast.   Yes [provider]  loperamide  (IMODIUM A-D) 2 MG tablet Take 1 tablet (2 mg total) by mouth 4 (four) times daily as needed for diarrhea or loose stools. 05/05/17  Yes Fenton Malling M, PA-C  losartan (COZAAR) 100 MG tablet Take 1 tablet (100 mg total) by mouth daily. 05/05/17  Yes Mar Daring, PA-C  metoprolol succinate (TOPROL-XL) 25 MG 24 hr tablet Take 1 tablet (25 mg total) by mouth daily. 05/05/17  Yes Mar Daring, PA-C  sertraline (ZOLOFT) 100 MG tablet Take 1.5 tablets (150 mg total) by mouth daily. 05/05/17  Yes Mar Daring, PA-C    Allergies as of 06/13/2017 - Review Complete 06/07/2017  Allergen Reaction Noted  . Asacol  [mesalamine]  01/30/2015  . Lisinopril Cough 01/30/2015    Family History  Problem Relation Age of Onset  . Alcohol abuse Mother   . Anxiety disorder Mother   . Hypothyroidism Mother   . Alcohol abuse Sister   . Anxiety disorder Sister   . Bipolar disorder Sister   . Alcohol abuse Brother   . Depression Maternal Grandmother   . Stroke Maternal Grandmother   . Alcohol abuse Maternal Grandfather   . Heart disease Maternal Grandfather   . Healthy Brother   . Breast cancer Neg Hx     Social History   Socioeconomic History  . Marital status: Widowed    Spouse name: Not on file  . Number of children: 0  .  Years of education: College  . Highest education level: Not on file  Social Needs  . Financial resource strain: Not on file  . Food insecurity - worry: Not on file  . Food insecurity - inability: Not on file  . Transportation needs - medical: Not on file  . Transportation needs - non-medical: Not on file  Occupational History  . Not on file  Tobacco Use  . Smoking status: Former Smoker    Packs/day: 1.00    Years: 33.00    Pack years: 33.00    Types: Cigarettes    Last attempt to quit: 05/24/2008    Years since quitting: 9.1  . Smokeless tobacco: Never Used  Substance and Sexual Activity  . Alcohol use: Yes    Comment: Occasional alcohol  use  . Drug use: No  . Sexual activity: Not on file  Other Topics Concern  . Not on file  Social History Narrative  . Not on file    Review of Systems: See HPI, otherwise negative ROS  Physical Exam: BP (!) 141/83   Pulse (!) 106   Temp (!) 97.3 F (36.3 C) (Tympanic)   Resp 20   Ht 5\' 8"  (1.727 m)   Wt 243 lb (110.2 kg)   SpO2 97%   BMI 36.95 kg/m  General:   Alert,  pleasant and cooperative in NAD Head:  Normocephalic and atraumatic. Neck:  Supple; no masses or thyromegaly. Lungs:  Clear throughout to auscultation.    Heart:  Regular rate and rhythm. Abdomen:  Soft, nontender and nondistended. Normal bowel sounds, without guarding, and without rebound.   Neurologic:  Alert and  oriented x4;  grossly normal neurologically.  Impression/Plan: [name] is here for an colonoscopy to be performed for personal h/o polyps  Risks, benefits, limitations, and alternatives regarding  colonoscopy have been reviewed with the patient.  Questions have been answered.  All parties agreeable.   Sherri Sear, MD  07/14/2017, 8:21 AM

## 2017-07-18 LAB — SURGICAL PATHOLOGY

## 2017-07-26 ENCOUNTER — Encounter: Payer: Self-pay | Admitting: Gastroenterology

## 2017-09-09 ENCOUNTER — Other Ambulatory Visit: Payer: Self-pay | Admitting: Physician Assistant

## 2017-09-09 DIAGNOSIS — F3341 Major depressive disorder, recurrent, in partial remission: Secondary | ICD-10-CM

## 2017-09-09 DIAGNOSIS — I10 Essential (primary) hypertension: Secondary | ICD-10-CM

## 2017-09-09 DIAGNOSIS — E7800 Pure hypercholesterolemia, unspecified: Secondary | ICD-10-CM

## 2017-11-04 ENCOUNTER — Encounter: Payer: Self-pay | Admitting: Physician Assistant

## 2017-11-04 ENCOUNTER — Ambulatory Visit (INDEPENDENT_AMBULATORY_CARE_PROVIDER_SITE_OTHER): Payer: Managed Care, Other (non HMO) | Admitting: Physician Assistant

## 2017-11-04 VITALS — BP 120/78 | HR 70 | Temp 97.8°F | Resp 16 | Ht 68.0 in | Wt 241.6 lb

## 2017-11-04 DIAGNOSIS — E109 Type 1 diabetes mellitus without complications: Secondary | ICD-10-CM | POA: Diagnosis not present

## 2017-11-04 DIAGNOSIS — Z0000 Encounter for general adult medical examination without abnormal findings: Secondary | ICD-10-CM

## 2017-11-04 DIAGNOSIS — E7800 Pure hypercholesterolemia, unspecified: Secondary | ICD-10-CM

## 2017-11-04 DIAGNOSIS — I10 Essential (primary) hypertension: Secondary | ICD-10-CM | POA: Diagnosis not present

## 2017-11-04 LAB — POCT UA - MICROALBUMIN: MICROALBUMIN (UR) POC: 20 mg/L

## 2017-11-04 NOTE — Patient Instructions (Signed)
Health Maintenance for Postmenopausal Women Menopause is a normal process in which your reproductive ability comes to an end. This process happens gradually over a span of months to years, usually between the ages of 20 and 74. Menopause is complete when you have missed 12 consecutive menstrual periods. It is important to talk with your health care provider about some of the most common conditions that affect postmenopausal women, such as heart disease, cancer, and bone loss (osteoporosis). Adopting a healthy lifestyle and getting preventive care can help to promote your health and wellness. Those actions can also lower your chances of developing some of these common conditions. What should I know about menopause? During menopause, you may experience a number of symptoms, such as:  Moderate-to-severe hot flashes.  Night sweats.  Decrease in sex drive.  Mood swings.  Headaches.  Tiredness.  Irritability.  Memory problems.  Insomnia.  Choosing to treat or not to treat menopausal changes is an individual decision that you make with your health care provider. What should I know about hormone replacement therapy and supplements? Hormone therapy products are effective for treating symptoms that are associated with menopause, such as hot flashes and night sweats. Hormone replacement carries certain risks, especially as you become older. If you are thinking about using estrogen or estrogen with progestin treatments, discuss the benefits and risks with your health care provider. What should I know about heart disease and stroke? Heart disease, heart attack, and stroke become more likely as you age. This may be due, in part, to the hormonal changes that your body experiences during menopause. These can affect how your body processes dietary fats, triglycerides, and cholesterol. Heart attack and stroke are both medical emergencies. There are many things that you can do to help prevent heart disease  and stroke:  Have your blood pressure checked at least every 1-2 years. High blood pressure causes heart disease and increases the risk of stroke.  If you are 70-87 years old, ask your health care provider if you should take aspirin to prevent a heart attack or a stroke.  Do not use any tobacco products, including cigarettes, chewing tobacco, or electronic cigarettes. If you need help quitting, ask your health care provider.  It is important to eat a healthy diet and maintain a healthy weight. ? Be sure to include plenty of vegetables, fruits, low-fat dairy products, and lean protein. ? Avoid eating foods that are high in solid fats, added sugars, or salt (sodium).  Get regular exercise. This is one of the most important things that you can do for your health. ? Try to exercise for at least 150 minutes each week. The type of exercise that you do should increase your heart rate and make you sweat. This is known as moderate-intensity exercise. ? Try to do strengthening exercises at least twice each week. Do these in addition to the moderate-intensity exercise.  Know your numbers.Ask your health care provider to check your cholesterol and your blood glucose. Continue to have your blood tested as directed by your health care provider.  What should I know about cancer screening? There are several types of cancer. Take the following steps to reduce your risk and to catch any cancer development as early as possible. Breast Cancer  Practice breast self-awareness. ? This means understanding how your breasts normally appear and feel. ? It also means doing regular breast self-exams. Let your health care provider know about any changes, no matter how small.  If you are 40  or older, have a clinician do a breast exam (clinical breast exam or CBE) every year. Depending on your age, family history, and medical history, it may be recommended that you also have a yearly breast X-ray (mammogram).  If you  have a family history of breast cancer, talk with your health care provider about genetic screening.  If you are at high risk for breast cancer, talk with your health care provider about having an MRI and a mammogram every year.  Breast cancer (BRCA) gene test is recommended for women who have family members with BRCA-related cancers. Results of the assessment will determine the need for genetic counseling and BRCA1 and for BRCA2 testing. BRCA-related cancers include these types: ? Breast. This occurs in males or females. ? Ovarian. ? Tubal. This may also be called fallopian tube cancer. ? Cancer of the abdominal or pelvic lining (peritoneal cancer). ? Prostate. ? Pancreatic.  Cervical, Uterine, and Ovarian Cancer Your health care provider may recommend that you be screened regularly for cancer of the pelvic organs. These include your ovaries, uterus, and vagina. This screening involves a pelvic exam, which includes checking for microscopic changes to the surface of your cervix (Pap test).  For women ages 21-65, health care providers may recommend a pelvic exam and a Pap test every three years. For women ages 55-65, they may recommend the Pap test and pelvic exam, combined with testing for human papilloma virus (HPV), every five years. Some types of HPV increase your risk of cervical cancer. Testing for HPV may also be done on women of any age who have unclear Pap test results.  Other health care providers may not recommend any screening for nonpregnant women who are considered low risk for pelvic cancer and have no symptoms. Ask your health care provider if a screening pelvic exam is right for you.  If you have had past treatment for cervical cancer or a condition that could lead to cancer, you need Pap tests and screening for cancer for at least 20 years after your treatment. If Pap tests have been discontinued for you, your risk factors (such as having a new sexual partner) need to be  reassessed to determine if you should start having screenings again. Some women have medical problems that increase the chance of getting cervical cancer. In these cases, your health care provider may recommend that you have screening and Pap tests more often.  If you have a family history of uterine cancer or ovarian cancer, talk with your health care provider about genetic screening.  If you have vaginal bleeding after reaching menopause, tell your health care provider.  There are currently no reliable tests available to screen for ovarian cancer.  Lung Cancer Lung cancer screening is recommended for adults 66-74 years old who are at high risk for lung cancer because of a history of smoking. A yearly low-dose CT scan of the lungs is recommended if you:  Currently smoke.  Have a history of at least 30 pack-years of smoking and you currently smoke or have quit within the past 15 years. A pack-year is smoking an average of one pack of cigarettes per day for one year.  Yearly screening should:  Continue until it has been 15 years since you quit.  Stop if you develop a health problem that would prevent you from having lung cancer treatment.  Colorectal Cancer  This type of cancer can be detected and can often be prevented.  Routine colorectal cancer screening usually begins at  age 42 and continues through age 45.  If you have risk factors for colon cancer, your health care provider may recommend that you be screened at an earlier age.  If you have a family history of colorectal cancer, talk with your health care provider about genetic screening.  Your health care provider may also recommend using home test kits to check for hidden blood in your stool.  A small camera at the end of a tube can be used to examine your colon directly (sigmoidoscopy or colonoscopy). This is done to check for the earliest forms of colorectal cancer.  Direct examination of the colon should be repeated every  5-10 years until age 71. However, if early forms of precancerous polyps or small growths are found or if you have a family history or genetic risk for colorectal cancer, you may need to be screened more often.  Skin Cancer  Check your skin from head to toe regularly.  Monitor any moles. Be sure to tell your health care provider: ? About any new moles or changes in moles, especially if there is a change in a mole's shape or color. ? If you have a mole that is larger than the size of a pencil eraser.  If any of your family members has a history of skin cancer, especially at a young age, talk with your health care provider about genetic screening.  Always use sunscreen. Apply sunscreen liberally and repeatedly throughout the day.  Whenever you are outside, protect yourself by wearing long sleeves, pants, a wide-brimmed hat, and sunglasses.  What should I know about osteoporosis? Osteoporosis is a condition in which bone destruction happens more quickly than new bone creation. After menopause, you may be at an increased risk for osteoporosis. To help prevent osteoporosis or the bone fractures that can happen because of osteoporosis, the following is recommended:  If you are 46-71 years old, get at least 1,000 mg of calcium and at least 600 mg of vitamin D per day.  If you are older than age 55 but younger than age 65, get at least 1,200 mg of calcium and at least 600 mg of vitamin D per day.  If you are older than age 54, get at least 1,200 mg of calcium and at least 800 mg of vitamin D per day.  Smoking and excessive alcohol intake increase the risk of osteoporosis. Eat foods that are rich in calcium and vitamin D, and do weight-bearing exercises several times each week as directed by your health care provider. What should I know about how menopause affects my mental health? Depression may occur at any age, but it is more common as you become older. Common symptoms of depression  include:  Low or sad mood.  Changes in sleep patterns.  Changes in appetite or eating patterns.  Feeling an overall lack of motivation or enjoyment of activities that you previously enjoyed.  Frequent crying spells.  Talk with your health care provider if you think that you are experiencing depression. What should I know about immunizations? It is important that you get and maintain your immunizations. These include:  Tetanus, diphtheria, and pertussis (Tdap) booster vaccine.  Influenza every year before the flu season begins.  Pneumonia vaccine.  Shingles vaccine.  Your health care provider may also recommend other immunizations. This information is not intended to replace advice given to you by your health care provider. Make sure you discuss any questions you have with your health care provider. Document Released: 07/02/2005  Document Revised: 11/28/2015 Document Reviewed: 02/11/2015 Elsevier Interactive Patient Education  2018 Elsevier Inc.  

## 2017-11-04 NOTE — Progress Notes (Signed)
Patient: Kristin [name], Female    DOB: January 28, 1961, 57 y.o.   MRN: [id number] Visit Date: 11/04/2017  Today's Provider: Mar Daring, PA-C   Chief Complaint  Patient presents with  . Annual Exam   Subjective:    Annual physical exam Kristin [name] is a 57 y.o. female who presents today for health maintenance and complete physical. She feels well. She reports exercising some. She reports she is sleeping well.  Patient is followed by Dr. Lucilla Lame at [facility] and reports that her A1C was 7.8 on 09/22/17. Last CPE:11/03/16 Pap:10/17/15-Negative Mammogram:06/06/17 BI-RADS 1 Colonoscopy:07/14/17- Polyps, Recheck 5 yrs Eye Exam: [place] done 06/2017 -----------------------------------------------------------------   Review of Systems  Constitutional: Negative.   HENT: Negative.   Eyes: Negative.   Respiratory: Negative.   Cardiovascular: Negative.   Gastrointestinal: Negative.   Endocrine: Negative.   Genitourinary: Negative.   Musculoskeletal: Negative.   Skin: Negative.   Allergic/Immunologic: Negative.   Neurological: Negative.   Hematological: Negative.   Psychiatric/Behavioral: Negative.     Social History      She  reports that she quit smoking about 9 years ago. Her smoking use included cigarettes. She has a 33.00 pack-year smoking history. She has never used smokeless tobacco. She reports that she drinks alcohol. She reports that she does not use drugs.       Social History   Socioeconomic History  . Marital status: Widowed    Spouse name: Not on file  . Number of children: 0  . Years of education: College  . Highest education level: Not on file  Occupational History  . Not on file  Social Needs  . Financial resource strain: Not on file  . Food insecurity:    Worry: Not on file    Inability: Not on file  . Transportation needs:    Medical: Not on file    Non-medical: Not on file  Tobacco Use  . Smoking status:  Former Smoker    Packs/day: 1.00    Years: 33.00    Pack years: 33.00    Types: Cigarettes    Last attempt to quit: 05/24/2008    Years since quitting: 9.4  . Smokeless tobacco: Never Used  Substance and Sexual Activity  . Alcohol use: Yes    Comment: Occasional alcohol use  . Drug use: No  . Sexual activity: Not on file  Lifestyle  . Physical activity:    Days per week: Not on file    Minutes per session: Not on file  . Stress: Not on file  Relationships  . Social connections:    Talks on phone: Not on file    Gets together: Not on file    Attends religious service: Not on file    Active member of club or organization: Not on file    Attends meetings of clubs or organizations: Not on file    Relationship status: Not on file  Other Topics Concern  . Not on file  Social History Narrative  . Not on file    Past Medical History:  Diagnosis Date  . Anxiety   . Depression   . Diabetes mellitus without complication ([employer])    type 1  . Elevated cholesterol   . Hypertension   . Hypothyroidism      Patient Active Problem List   Diagnosis Date Noted  . Personal history of colonic polyps   . BMI 37.0-37.9, adult 07/02/2016  . 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 Pain in joints of left hand 08/29/2015  . Anxiety 01/30/2015  . Benign essential HTN 01/30/2015  . Clinical depression 01/30/2015  . Esophagitis, reflux 01/30/2015  . Hypercholesteremia 01/30/2015  . Hemorrhoids, internal 01/30/2015  . Anemia, iron deficiency 01/30/2015  . Breath shortness 01/30/2015  . Apnea, sleep 01/30/2015  . 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 History of cigarette smoking 01/30/2015  . Avitaminosis D 01/30/2015  . Type 1 diabetes mellitus without complication ([place]) 30/16/0109  . Acquired hypothyroidism 04/26/2014  . Collagenous colitis 10/31/2007    Past Surgical History:  Procedure Laterality Date  . BREAST BIOPSY Left    bx/clip-neg  . 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 CARPAL TUNNEL RELEASE Left 11/06/2015   Procedure: CARPAL TUNNEL RELEASE;  Surgeon: Hessie Knows, MD;  Location: [facility] ORS;  Service: Orthopedics;  Laterality: Left;  . COLONOSCOPY WITH PROPOFOL N/A 07/14/2017   Procedure: COLONOSCOPY WITH PROPOFOL;  Surgeon: Lin Landsman, MD;  Location: [facility] ENDOSCOPY;  Service: Gastroenterology;  Laterality: N/A;  . ctr Right   . HAND SURGERY Right 2010   carpal tunnel  . THYROID LOBECTOMY Left 03/2000    Family History        Family Status  Relation Name Status  . Mother  Alive  . Sister  Alive  . Brother  Deceased       MVA  . MGM  Deceased  . MGF  Deceased  . Brother  Alive  . Father Unknown (Not Specified)  . Neg Hx  (Not Specified)        Her family history includes Alcohol abuse in her brother, maternal grandfather, mother, and sister; Anxiety disorder in her mother and sister; Bipolar disorder in her sister; Depression in her maternal grandmother; Healthy in her brother; Heart disease in her maternal grandfather; Hypothyroidism in her mother; Stroke in her maternal grandmother. There is no history of Breast cancer.      Allergies  Allergen Reactions  . Asacol  [Mesalamine]     Other reaction(s): Abdominal pain  . Lisinopril Cough     Current Outpatient Medications:  .  ALPRAZolam (XANAX) 0.5 MG tablet, Take 1 tablet (0.5 mg total) by mouth 2 (two) times daily as needed for anxiety., Disp: 60 tablet, Rfl: 1 .  aspirin 81 MG tablet, Take 81 mg by mouth daily., Disp: , Rfl:  .  atorvastatin (LIPITOR) 40 MG tablet, TAKE 1 TABLET BY MOUTH AT  BEDTIME, Disp: 90 tablet, Rfl: 1 .  Cholecalciferol (VITAMIN D3) 2000 units TABS, Take 2 capsules by mouth daily., Disp: , Rfl:  .  Insulin Glargine (LANTUS SOLOSTAR) 100 UNIT/ML Solostar Pen, Inject 44 Units into the skin daily at 10 pm. , Disp: , Rfl:  .  Insulin Lispro (HUMALOG KWIKPEN [place]), Inject into the skin., Disp: , Rfl:  .  levothyroxine (SYNTHROID,  LEVOTHROID) 200 MCG tablet, Take 200 mcg by mouth daily before breakfast., Disp: , Rfl:  .  loperamide (IMODIUM A-D) 2 MG tablet, Take 1 tablet (2 mg total) by mouth 4 (four) times daily as needed for diarrhea or loose stools., Disp: 30 tablet, Rfl: 0 .  losartan (COZAAR) 100 MG tablet, TAKE 1 TABLET BY MOUTH  DAILY, Disp: 90 tablet, Rfl: 1 .  metoprolol succinate (TOPROL-XL) 25 MG 24 hr tablet, TAKE 1 TABLET BY MOUTH  DAILY, Disp: 90 tablet, Rfl: 1 .  sertraline (ZOLOFT) 100 MG tablet, TAKE 1 AND 1/2  TABLETS BY  MOUTH DAILY, Disp: 135 tablet, Rfl: 1   Patient Care Team: Mar Daring, PA-C as PCP - General (Family Medicine)      Objective:   Vitals: BP 120/78 (BP Location: Left Arm, Patient Position: Sitting, Cuff Size: Normal)   Pulse 70   Temp 97.8 F (36.6 C) (Oral)   Resp 16   Ht 5\' 8"  (1.727 m)   Wt 241 lb 9.6 oz (109.6 kg)   SpO2 97%   BMI 36.74 kg/m    Vitals:   11/04/17 0918  BP: 120/78  Pulse: 70  Resp: 16  Temp: 97.8 F (36.6 C)  TempSrc: Oral  SpO2: 97%  Weight: 241 lb 9.6 oz (109.6 kg)  Height: 5\' 8"  (1.727 m)     Physical Exam  Constitutional: She is oriented to person, place, and time. She appears well-developed and well-nourished. No distress.  HENT:  Head: Normocephalic and atraumatic.  Right Ear: Hearing, tympanic membrane, external ear and ear canal normal.  Left Ear: Hearing, tympanic membrane, external ear and ear canal normal.  Nose: Nose normal.  Mouth/Throat: Uvula is midline, oropharynx is clear and moist and mucous membranes are normal. No oropharyngeal exudate.  Eyes: Pupils are equal, round, and reactive to light. Conjunctivae and EOM are normal. Right eye exhibits no discharge. Left eye exhibits no discharge. No scleral icterus.  Neck: Normal range of motion. Neck supple. No JVD present. Carotid bruit is not present. No tracheal deviation present. No thyromegaly present.  Cardiovascular: Normal rate, regular rhythm, normal heart sounds  and intact distal pulses. Exam reveals no gallop and no friction rub.  No murmur heard. Pulses:      Dorsalis pedis pulses are 2+ on the right side, and 2+ on the left side.       Posterior tibial pulses are 2+ on the right side, and 2+ on the left side.  Pulmonary/Chest: Effort normal and breath sounds normal. No respiratory distress. She has no wheezes. She has no rales. She exhibits no tenderness.  Abdominal: Soft. Bowel sounds are normal. She exhibits no distension and no mass. There is no tenderness. There is no rebound and no guarding.  Musculoskeletal: Normal range of motion. She exhibits no edema or tenderness.       Right foot: There is normal range of motion and no deformity.       Left foot: There is normal range of motion and no deformity.  Feet:  Right Foot:  Protective Sensation: 10 sites tested. 10 sites sensed.  Left Foot:  Protective Sensation: 10 sites tested. 10 sites sensed.  Lymphadenopathy:    She has no cervical adenopathy.  Neurological: She is alert and oriented to person, place, and time.  Skin: Skin is warm and dry. No rash noted. She is not diaphoretic.  Psychiatric: She has a normal mood and affect. Her behavior is normal. Judgment and thought content normal.  Vitals reviewed.   Depression Screen PHQ 2/9 Scores 11/04/2017 05/05/2017 11/03/2016  PHQ - 2 Score 0 0 0  PHQ- 9 Score - 0 0      Assessment & Plan:     Routine Health Maintenance and Physical Exam  Exercise Activities and Dietary recommendations Goals    None      Immunization History  Administered Date(s) Administered  . H1N1 08/01/2008  . Influenza Split 03/31/2012, 04/14/2015  . Influenza,inj,Quad PF,6+ Mos 04/17/2013, 04/26/2014  . Influenza-Unspecified 02/19/2016, 02/11/2017  . Pneumococcal Polysaccharide-23 04/17/2013  . Tdap 01/14/2011  .  Zoster 05/15/2013    Health Maintenance  Topic Date Due  . FOOT EXAM  10/19/1968  . HEMOGLOBIN A1C  04/03/2017  . OPHTHALMOLOGY EXAM   04/23/2017  . INFLUENZA VACCINE  12/22/2017  . PNEUMOCOCCAL POLYSACCHARIDE VACCINE (2) 04/17/2018  . PAP SMEAR  10/17/2018  . MAMMOGRAM  06/07/2019  . TETANUS/TDAP  01/13/2021  . COLONOSCOPY  07/14/2022  . Hepatitis C Screening  Completed  . HIV Screening  Completed     Discussed health benefits of physical activity, and encouraged her to engage in regular exercise appropriate for her age and condition.    1. Annual physical exam Normal physical exam today. Will check labs as below and f/u pending lab results. If labs are stable and WNL she will not need to have these rechecked for one year at her next annual physical exam. She is to call the office in the meantime if she has any acute issue, questions or concerns. - Comprehensive metabolic panel - CBC with Differential/Platelet - TSH  2. Hypercholesteremia Stable. Continue atorvastatin 40mg . Will check labs as below and f/u pending results. - Lipid panel  3. Benign essential HTN Stable. Continue Metoprolol 25mg , losartan 100mg . Will check labs as below and f/u pending results. - Comprehensive metabolic panel - CBC with Differential/Platelet  4. Type 1 diabetes mellitus without complication (HCC) Microalbumin 20. Losartan 100mg  taken. A1c and insulin dosing followed by Dr. Gabriel Carina, Endocrinology. - POCT UA - Microalbumin  --------------------------------------------------------------------    Mar Daring, PA-C  [facility]

## 2017-11-05 LAB — COMPREHENSIVE METABOLIC PANEL
A/G RATIO: 1.6 (ref 1.2–2.2)
ALBUMIN: 3.9 g/dL (ref 3.5–5.5)
ALT: 21 IU/L (ref 0–32)
AST: 21 IU/L (ref 0–40)
Alkaline Phosphatase: 70 IU/L (ref 39–117)
BUN / CREAT RATIO: 19 (ref 9–23)
BUN: 14 mg/dL (ref 6–24)
Bilirubin Total: 0.3 mg/dL (ref 0.0–1.2)
CO2: 27 mmol/L (ref 20–29)
CREATININE: 0.75 mg/dL (ref 0.57–1.00)
Calcium: 9.4 mg/dL (ref 8.7–10.2)
Chloride: 102 mmol/L (ref 96–106)
GFR, EST AFRICAN AMERICAN: 101 mL/min/{1.73_m2} (ref >59)
GFR, EST NON AFRICAN AMERICAN: 88 mL/min/{1.73_m2} (ref >59)
GLOBULIN, TOTAL: 2.4 g/dL (ref 1.5–4.5)
Glucose: 61 mg/dL — ABNORMAL LOW (ref 65–99)
POTASSIUM: 4.7 mmol/L (ref 3.5–5.2)
SODIUM: 140 mmol/L (ref 134–144)
Total Protein: 6.3 g/dL (ref 6.0–8.5)

## 2017-11-05 LAB — CBC WITH DIFFERENTIAL/PLATELET
BASOS: 0 %
Basophils Absolute: 0 10*3/uL (ref 0.0–0.2)
EOS (ABSOLUTE): 0.1 10*3/uL (ref 0.0–0.4)
EOS: 2 %
Hematocrit: 39.5 % (ref 34.0–46.6)
Hemoglobin: 12.8 g/dL (ref 11.1–15.9)
Immature Grans (Abs): 0 10*3/uL (ref 0.0–0.1)
Immature Granulocytes: 0 %
LYMPHS ABS: 2.5 10*3/uL (ref 0.7–3.1)
Lymphs: 45 %
MCH: 29.4 pg (ref 26.6–33.0)
MCHC: 32.4 g/dL (ref 31.5–35.7)
MCV: 91 fL (ref 79–97)
MONOS ABS: 0.5 10*3/uL (ref 0.1–0.9)
Monocytes: 10 %
NEUTROS ABS: 2.3 10*3/uL (ref 1.4–7.0)
NEUTROS PCT: 43 %
PLATELETS: 219 10*3/uL (ref 150–450)
RBC: 4.35 x10E6/uL (ref 3.77–5.28)
RDW: 13.9 % (ref 12.3–15.4)
WBC: 5.4 10*3/uL (ref 3.4–10.8)

## 2017-11-05 LAB — LIPID PANEL
CHOL/HDL RATIO: 2.2 ratio (ref 0.0–4.4)
Cholesterol, Total: 137 mg/dL (ref 100–199)
HDL: 63 mg/dL (ref >39)
LDL Calculated: 59 mg/dL (ref 0–99)
TRIGLYCERIDES: 77 mg/dL (ref 0–149)
VLDL Cholesterol Cal: 15 mg/dL (ref 5–40)

## 2017-11-05 LAB — TSH: TSH: 0.458 u[IU]/mL (ref 0.450–4.500)

## 2017-11-07 ENCOUNTER — Telehealth: Payer: Self-pay

## 2017-11-07 NOTE — Telephone Encounter (Signed)
Viewed by Claiborne Billings Re-Herriott on 11/07/2017 9:52 AM

## 2017-11-07 NOTE — Telephone Encounter (Signed)
-----   Message from Mar Daring, PA-C sent at 11/07/2017  9:19 AM EDT ----- All labs are within normal limits and stable.  Thanks! -JB

## 2018-02-10 ENCOUNTER — Encounter: Payer: Self-pay | Admitting: Gastroenterology

## 2018-02-21 ENCOUNTER — Other Ambulatory Visit: Payer: Self-pay | Admitting: Physician Assistant

## 2018-02-21 ENCOUNTER — Other Ambulatory Visit: Payer: Self-pay | Admitting: Gastroenterology

## 2018-02-21 DIAGNOSIS — K52831 Collagenous colitis: Secondary | ICD-10-CM

## 2018-02-21 DIAGNOSIS — E7800 Pure hypercholesterolemia, unspecified: Secondary | ICD-10-CM

## 2018-02-21 DIAGNOSIS — I10 Essential (primary) hypertension: Secondary | ICD-10-CM

## 2018-02-21 MED ORDER — BUDESONIDE 3 MG PO CPEP: 9.0000 mg | ORAL_CAPSULE | Freq: Every day | ORAL | 0 refills | Status: AC

## 2018-02-22 ENCOUNTER — Other Ambulatory Visit: Payer: Self-pay | Admitting: Physician Assistant

## 2018-02-22 DIAGNOSIS — F3341 Major depressive disorder, recurrent, in partial remission: Secondary | ICD-10-CM

## 2018-02-22 DIAGNOSIS — I10 Essential (primary) hypertension: Secondary | ICD-10-CM

## 2018-03-09 ENCOUNTER — Other Ambulatory Visit: Payer: Self-pay | Admitting: Physician Assistant

## 2018-03-09 DIAGNOSIS — F419 Anxiety disorder, unspecified: Secondary | ICD-10-CM

## 2018-03-09 MED ORDER — ALPRAZOLAM 0.5 MG PO TABS: 0.5000 mg | ORAL_TABLET | Freq: Two times a day (BID) | ORAL | 1 refills | Status: DC | PRN

## 2018-03-09 NOTE — Telephone Encounter (Signed)
Pt needing a refill on:  ALPRAZolam Duanne Moron) 0.5 MG tablet  Please fill at:  [facility] [place], [place] [address]   Contact number [phone] Store [phone]     Thanks, [place]

## 2018-07-18 ENCOUNTER — Other Ambulatory Visit: Payer: Self-pay | Admitting: Physician Assistant

## 2018-07-18 DIAGNOSIS — Z1231 Encounter for screening mammogram for malignant neoplasm of breast: Secondary | ICD-10-CM

## 2018-07-26 ENCOUNTER — Ambulatory Visit
Admission: RE | Admit: 2018-07-26 | Discharge: 2018-07-26 | Disposition: A | Discharge status: Home / Self Care | Payer: Managed Care, Other (non HMO) | Source: Ambulatory Visit | Attending: Physician Assistant | Admitting: Physician Assistant

## 2018-07-26 DIAGNOSIS — Z1231 Encounter for screening mammogram for malignant neoplasm of breast: Secondary | ICD-10-CM | POA: Diagnosis not present

## 2018-08-10 ENCOUNTER — Other Ambulatory Visit: Payer: Self-pay | Admitting: Physician Assistant

## 2018-08-10 DIAGNOSIS — I10 Essential (primary) hypertension: Secondary | ICD-10-CM

## 2018-08-10 DIAGNOSIS — E7800 Pure hypercholesterolemia, unspecified: Secondary | ICD-10-CM

## 2018-08-11 ENCOUNTER — Other Ambulatory Visit: Payer: Self-pay | Admitting: Physician Assistant

## 2018-08-11 DIAGNOSIS — F3341 Major depressive disorder, recurrent, in partial remission: Secondary | ICD-10-CM

## 2018-08-26 ENCOUNTER — Other Ambulatory Visit: Payer: Self-pay | Admitting: Physician Assistant

## 2018-08-26 DIAGNOSIS — F3341 Major depressive disorder, recurrent, in partial remission: Secondary | ICD-10-CM

## 2018-09-07 ENCOUNTER — Other Ambulatory Visit: Payer: Self-pay | Admitting: Physician Assistant

## 2018-09-07 DIAGNOSIS — F419 Anxiety disorder, unspecified: Secondary | ICD-10-CM

## 2018-09-07 NOTE — Telephone Encounter (Signed)
Please Review

## 2018-10-26 ENCOUNTER — Other Ambulatory Visit: Payer: Self-pay | Admitting: Gastroenterology

## 2018-10-26 DIAGNOSIS — K52831 Collagenous colitis: Secondary | ICD-10-CM

## 2018-10-30 LAB — HEMOGLOBIN A1C: Hemoglobin A1C: 7.4

## 2018-11-06 ENCOUNTER — Telehealth: Payer: Self-pay | Admitting: Gastroenterology

## 2018-11-06 NOTE — Telephone Encounter (Signed)
Patient came in & states she tried calling [facility] to have Budesonide cap 3mg /24hr refilled. Please call this in to them. Please contact patient if necessary.

## 2018-11-06 NOTE — Telephone Encounter (Signed)
Please call pt and schedule medication refill appt, pt has not been seen in office since Oct 2018

## 2018-11-10 NOTE — Progress Notes (Addendum)
Patient: Kristin [name], Female    DOB: September 13, 1960, 58 y.o.   MRN: [id number] Visit Date: 11/13/2018  Today's Provider: Mar Daring, PA-C   Chief Complaint  Patient presents with   Annual Exam   Subjective:     Annual physical exam Kristin [name] is a 58 y.o. female who presents today for health maintenance and complete physical. She feels well. She reports exercising none. She reports she is sleeping well. -----------------------------------------------------------------   Review of Systems  Constitutional: Negative.   HENT: Positive for rhinorrhea and sinus pressure.   Eyes: Negative.   Respiratory: Negative.   Cardiovascular: Negative.   Gastrointestinal: Negative.   Endocrine: Negative.   Genitourinary: Negative.   Musculoskeletal: Negative.   Skin: Negative.   Allergic/Immunologic: Negative.   Neurological: Negative.   Hematological: Negative.   Psychiatric/Behavioral: Negative.     Social History      She  reports that she quit smoking about 10 years ago. Her smoking use included cigarettes. She has a 33.00 pack-year smoking history. She has never used smokeless tobacco. She reports current alcohol use. She reports that she does not use drugs.       Social History   Socioeconomic History   Marital status: Widowed    Spouse name: Not on file   Number of children: 0   Years of education: College   Highest education level: Not on file  Occupational History   Not on file  Social Needs   Financial resource strain: Not on file   Food insecurity    Worry: Not on file    Inability: Not on file   Transportation needs    Medical: Not on file    Non-medical: Not on file  Tobacco Use   Smoking status: Former Smoker    Packs/day: 1.00    Years: 33.00    Pack years: 33.00    Types: Cigarettes    Quit date: 05/24/2008    Years since quitting: 10.4   Smokeless tobacco: Never Used  Substance and Sexual Activity   Alcohol use: Yes      Comment: Occasional alcohol use   Drug use: No   Sexual activity: Not on file  Lifestyle   Physical activity    Days per week: Not on file    Minutes per session: Not on file   Stress: Not on file  Relationships   Social connections    Talks on phone: Not on file    Gets together: Not on file    Attends religious service: Not on file    Active member of club or organization: Not on file    Attends meetings of clubs or organizations: Not on file    Relationship status: Not on file  Other Topics Concern   Not on file  Social History Narrative   Not on file    Past Medical History:  Diagnosis Date   Anxiety    Depression    Diabetes mellitus without complication ([place])    type 1   Elevated cholesterol    Hypertension    Hypothyroidism      Patient Active Problem List   Diagnosis Date Noted   Personal history of colonic polyps    BMI 37.0-37.9, adult 07/02/2016   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   Pain in joints of left hand 08/29/2015   Anxiety 01/30/2015   Benign essential HTN 01/30/2015   Clinical depression 01/30/2015   Esophagitis, reflux 01/30/2015   Hypercholesteremia 01/30/2015  Hemorrhoids, internal 01/30/2015   Anemia, iron deficiency 01/30/2015   Breath shortness 01/30/2015   Apnea, sleep 01/30/2015   Cyst of thyroid 01/30/2015   History of cigarette smoking 01/30/2015   Avitaminosis D 01/30/2015   Type 1 diabetes mellitus without complication ([place]) 01/04/4817   Acquired hypothyroidism 04/26/2014   Collagenous colitis 10/31/2007    Past Surgical History:  Procedure Laterality Date   BREAST BIOPSY Left    bx/clip-neg   [place]   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   CARPAL TUNNEL RELEASE Left 11/06/2015   Procedure: CARPAL TUNNEL RELEASE;  Surgeon: Hessie Knows, MD;  Location: [facility] ORS;  Service: Orthopedics;  Laterality: Left;   COLONOSCOPY WITH PROPOFOL N/A 07/14/2017    Procedure: COLONOSCOPY WITH PROPOFOL;  Surgeon: Lin Landsman, MD;  Location: [facility] ENDOSCOPY;  Service: Gastroenterology;  Laterality: N/A;   ctr Right    HAND SURGERY Right 2010   carpal tunnel   THYROID LOBECTOMY Left 03/2000    Family History        Family Status  Relation Name Status   Mother  Alive   Sister  Alive   Brother  Deceased       MVA   MGM  Deceased   MGF  Deceased   Brother  Alive   Father Unknown (Not Specified)   Neg Hx  (Not Specified)        Her family history includes Alcohol abuse in her brother, maternal grandfather, mother, and sister; Anxiety disorder in her mother and sister; Bipolar disorder in her sister; Depression in her maternal grandmother; Healthy in her brother; Heart disease in her maternal grandfather; Hypothyroidism in her mother; Stroke in her maternal grandmother. There is no history of Breast cancer.      Allergies  Allergen Reactions   Asacol  [Mesalamine]     Other reaction(s): Abdominal pain   Lisinopril Cough     Current Outpatient Medications:    ALPRAZolam (XANAX) 0.5 MG tablet, TAKE 1 TABLET (0.5 MG TOTAL) BY MOUTH 2 (TWO) TIMES DAILY AS NEEDED FOR ANXIETY., Disp: 60 tablet, Rfl: 5   aspirin 81 MG tablet, Take 81 mg by mouth daily., Disp: , Rfl:    atorvastatin (LIPITOR) 40 MG tablet, TAKE 1 TABLET BY MOUTH AT  BEDTIME, Disp: 90 tablet, Rfl: 1   Cholecalciferol (VITAMIN D3) 2000 units TABS, Take 2 capsules by mouth daily., Disp: , Rfl:    Insulin Glargine (LANTUS SOLOSTAR) 100 UNIT/ML Solostar Pen, Inject 44 Units into the skin daily at 10 pm. , Disp: , Rfl:    Insulin Lispro (HUMALOG KWIKPEN [place]), Inject into the skin., Disp: , Rfl:    levothyroxine (SYNTHROID, LEVOTHROID) 200 MCG tablet, Take 200 mcg by mouth daily before breakfast., Disp: , Rfl:    loperamide (IMODIUM A-D) 2 MG tablet, Take 1 tablet (2 mg total) by mouth 4 (four) times daily as needed for diarrhea or loose stools., Disp: 30 tablet,  Rfl: 0   losartan (COZAAR) 100 MG tablet, TAKE 1 TABLET BY MOUTH  DAILY, Disp: 90 tablet, Rfl: 1   metoprolol succinate (TOPROL-XL) 25 MG 24 hr tablet, TAKE 1 TABLET BY MOUTH  DAILY, Disp: 90 tablet, Rfl: 1   sertraline (ZOLOFT) 100 MG tablet, TAKE 1 AND 1/2 TABLETS BY  MOUTH DAILY, Disp: 135 tablet, Rfl: 1   Patient Care Team: Rubye Beach as PCP - General (Family Medicine)    Objective:    Vitals: BP 134/83 (BP  Location: Left Arm, Patient Position: Sitting, Cuff Size: Large)    Pulse 77    Temp 98.2 F (36.8 C) (Oral)    Resp 16    Ht 5\' 8"  (1.727 m)    Wt 259 lb 3.2 oz (117.6 kg)    BMI 39.41 kg/m    Vitals:   11/13/18 0906  BP: 134/83  Pulse: 77  Resp: 16  Temp: 98.2 F (36.8 C)  TempSrc: Oral  Weight: 259 lb 3.2 oz (117.6 kg)  Height: 5\' 8"  (1.727 m)     Physical Exam Vitals signs reviewed.  Constitutional:      General: She is not in acute distress.    Appearance: Normal appearance. She is well-developed. She is not diaphoretic.  HENT:     Head: Normocephalic and atraumatic.     Right Ear: Hearing, tympanic membrane, ear canal and external ear normal.     Left Ear: Hearing, tympanic membrane, ear canal and external ear normal.     Nose: Nose normal.     Mouth/Throat:     Mouth: Mucous membranes are moist.     Pharynx: Oropharynx is clear. Uvula midline. No oropharyngeal exudate.  Eyes:     General: No scleral icterus.       Right eye: No discharge.        Left eye: No discharge.     Extraocular Movements: Extraocular movements intact.     Conjunctiva/sclera: Conjunctivae normal.     Pupils: Pupils are equal, round, and reactive to light.  Neck:     Musculoskeletal: Normal range of motion and neck supple.     Thyroid: No thyromegaly.     Vascular: No carotid bruit or JVD.     Trachea: No tracheal deviation.  Cardiovascular:     Rate and Rhythm: Normal rate and regular rhythm.     Pulses: Normal pulses.     Heart sounds: Normal heart sounds.  No murmur. No friction rub. No gallop.   Pulmonary:     Effort: Pulmonary effort is normal. No respiratory distress.     Breath sounds: Normal breath sounds. No wheezing or rales.  Chest:     Chest wall: No tenderness.     Breasts: Breasts are symmetrical.        Right: No inverted nipple, mass, nipple discharge, skin change or tenderness.        Left: No inverted nipple, mass, nipple discharge, skin change or tenderness.  Abdominal:     General: Bowel sounds are normal. There is no distension.     Palpations: Abdomen is soft. There is no mass.     Tenderness: There is no abdominal tenderness. There is no guarding or rebound.     Hernia: There is no hernia in the left inguinal area.  Genitourinary:    General: Normal vulva.     Exam position: Supine.     Labia:        Right: No rash, tenderness, lesion or injury.        Left: No rash, tenderness, lesion or injury.      Vagina: Normal. No signs of injury. No vaginal discharge, erythema, tenderness or bleeding.     Cervix: No cervical motion tenderness, discharge or friability.     Adnexa:        Right: No mass, tenderness or fullness.         Left: No mass, tenderness or fullness.       Rectum: Normal.  Musculoskeletal: Normal range  of motion.        General: No tenderness.  Lymphadenopathy:     Cervical: No cervical adenopathy.  Skin:    General: Skin is warm and dry.     Findings: No rash.  Neurological:     Mental Status: She is alert and oriented to person, place, and time.     Cranial Nerves: No cranial nerve deficit.     Coordination: Coordination normal.     Deep Tendon Reflexes: Reflexes are normal and symmetric.  Psychiatric:        Mood and Affect: Mood normal.        Behavior: Behavior normal.        Thought Content: Thought content normal.        Judgment: Judgment normal.    Diabetic Foot Exam - Simple   Simple Foot Form Visual Inspection No deformities, no ulcerations, no other skin breakdown bilaterally:  Yes Sensation Testing Intact to touch and monofilament testing bilaterally: Yes Pulse Check Posterior Tibialis and Dorsalis pulse intact bilaterally: Yes Comments   Addended chart due to accidentally forgetting to document foot exam that was done.   Depression Screen PHQ 2/9 Scores 11/13/2018 11/04/2017 05/05/2017 11/03/2016  PHQ - 2 Score 1 0 0 0  PHQ- 9 Score 2 - 0 0       Assessment & Plan:     Routine Health Maintenance and Physical Exam  Exercise Activities and Dietary recommendations Goals   None     Immunization History  Administered Date(s) Administered   H1N1 08/01/2008   Influenza Split 03/31/2012, 04/14/2015   Influenza,inj,Quad PF,6+ Mos 04/17/2013, 04/26/2014   Influenza-Unspecified 02/19/2016, 02/11/2017   Pneumococcal Polysaccharide-23 04/17/2013   Tdap 01/14/2011   Zoster 05/15/2013    Health Maintenance  Topic Date Due   FOOT EXAM  10/19/1968   HEMOGLOBIN A1C  04/03/2017   OPHTHALMOLOGY EXAM  04/23/2017   PAP SMEAR-Modifier  10/17/2018   INFLUENZA VACCINE  12/23/2018   MAMMOGRAM  07/25/2020   TETANUS/TDAP  01/13/2021   COLONOSCOPY  07/14/2022   PNEUMOCOCCAL POLYSACCHARIDE VACCINE AGE 97-64 HIGH RISK  Completed   Hepatitis C Screening  Completed   HIV Screening  Completed     Discussed health benefits of physical activity, and encouraged her to engage in regular exercise appropriate for her age and condition.    1. Annual physical exam Normal physical exam today. Will check labs as below and f/u pending lab results. If labs are stable and WNL she will not need to have these rechecked for one year at her next annual physical exam. She is to call the office in the meantime if she has any acute issue, questions or concerns.  2. Hypercholesteremia Stable. Continue Atorvastatin 40mg . Will check labs as below and f/u pending results. - Lipid panel - Hepatic function panel  3. Type 1 diabetes mellitus without complication  (HCC) Stable. Last A1c was 7.4 on 10/30/18. Followed by endocrinology, Dr. Gabriel Carina.   4. Pap smear for cervical cancer screening Pap collected today. Will send as below and f/u pending results. - Cytology - PAP  5. Acquired hypothyroidism Stable. Continue levothyroxine 27mcg as directed by Endocrinology, Dr. Gabriel Carina.   6. Benign essential HTN Stable. Continue Losartan 100mg  and metoprolol XR 25mg  daily. Will check labs as below and f/u pending results. - CBC with Differential/Platelet - Lipid panel - Hepatic function panel  7. Avitaminosis D Stable. Continue Vit D 2000 IU daily.  - Vitamin D (25 hydroxy)  8. Iron deficiency  anemia secondary to inadequate dietary iron intake Stable. Reports she is taking slow release iron OTC. Will check labs as below and f/u pending results. - CBC with Differential/Platelet - Fe+TIBC+Fer  9. Class 2 severe obesity due to excess calories with serious comorbidity and body mass index (BMI) of 39.0 to 39.9 in adult [facility]) Counseled patient on healthy lifestyle modifications including dieting and exercise.  - Lipid panel - Hepatic function panel  10. Recurrent major depressive disorder, in partial remission (HCC) Stable. Continue Sertraline 10mg .   11. Need for shingles vaccine Shingrix Vaccine #1 given to patient without complications. Patient sat for 15 minutes after administration and was tolerated well without adverse effects. Return in 2 months for next injection.  - Varicella-zoster vaccine IM (Shingrix)  --------------------------------------------------------------------    Mar Daring, PA-C  [facility]

## 2018-11-13 ENCOUNTER — Telehealth: Payer: Self-pay

## 2018-11-13 ENCOUNTER — Encounter: Payer: Self-pay | Admitting: Physician Assistant

## 2018-11-13 ENCOUNTER — Other Ambulatory Visit: Payer: Self-pay

## 2018-11-13 ENCOUNTER — Ambulatory Visit (INDEPENDENT_AMBULATORY_CARE_PROVIDER_SITE_OTHER): Payer: Managed Care, Other (non HMO) | Admitting: Physician Assistant

## 2018-11-13 ENCOUNTER — Other Ambulatory Visit (HOSPITAL_COMMUNITY)
Admission: RE | Admit: 2018-11-13 | Discharge: 2018-11-13 | Disposition: A | Discharge status: Home / Self Care | Payer: Managed Care, Other (non HMO) | Source: Ambulatory Visit | Attending: Physician Assistant | Admitting: Physician Assistant

## 2018-11-13 VITALS — BP 134/83 | HR 77 | Temp 98.2°F | Resp 16 | Ht 68.0 in | Wt 259.2 lb

## 2018-11-13 DIAGNOSIS — F3341 Major depressive disorder, recurrent, in partial remission: Secondary | ICD-10-CM | POA: Insufficient documentation

## 2018-11-13 DIAGNOSIS — Z6839 Body mass index (BMI) 39.0-39.9, adult: Secondary | ICD-10-CM

## 2018-11-13 DIAGNOSIS — Z0000 Encounter for general adult medical examination without abnormal findings: Secondary | ICD-10-CM

## 2018-11-13 DIAGNOSIS — Z124 Encounter for screening for malignant neoplasm of cervix: Secondary | ICD-10-CM

## 2018-11-13 DIAGNOSIS — D508 Other iron deficiency anemias: Secondary | ICD-10-CM

## 2018-11-13 DIAGNOSIS — E109 Type 1 diabetes mellitus without complications: Secondary | ICD-10-CM | POA: Diagnosis not present

## 2018-11-13 DIAGNOSIS — Z23 Encounter for immunization: Secondary | ICD-10-CM | POA: Diagnosis not present

## 2018-11-13 DIAGNOSIS — E7800 Pure hypercholesterolemia, unspecified: Secondary | ICD-10-CM

## 2018-11-13 DIAGNOSIS — E039 Hypothyroidism, unspecified: Secondary | ICD-10-CM

## 2018-11-13 DIAGNOSIS — I10 Essential (primary) hypertension: Secondary | ICD-10-CM

## 2018-11-13 DIAGNOSIS — E559 Vitamin D deficiency, unspecified: Secondary | ICD-10-CM

## 2018-11-13 NOTE — Telephone Encounter (Signed)
New order placed

## 2018-11-13 NOTE — Patient Instructions (Signed)
Health Maintenance for Postmenopausal Women Menopause is a normal process in which your reproductive ability comes to an end. This process happens gradually over a span of months to years, usually between the ages of 62 and 89. Menopause is complete when you have missed 12 consecutive menstrual periods. It is important to talk with your health care provider about some of the most common conditions that affect postmenopausal women, such as heart disease, cancer, and bone loss (osteoporosis). Adopting a healthy lifestyle and getting preventive care can help to promote your health and wellness. Those actions can also lower your chances of developing some of these common conditions. What should I know about menopause? During menopause, you may experience a number of symptoms, such as:  Moderate-to-severe hot flashes.  Night sweats.  Decrease in sex drive.  Mood swings.  Headaches.  Tiredness.  Irritability.  Memory problems.  Insomnia. Choosing to treat or not to treat menopausal changes is an individual decision that you make with your health care provider. What should I know about hormone replacement therapy and supplements? Hormone therapy products are effective for treating symptoms that are associated with menopause, such as hot flashes and night sweats. Hormone replacement carries certain risks, especially as you become older. If you are thinking about using estrogen or estrogen with progestin treatments, discuss the benefits and risks with your health care provider. What should I know about heart disease and stroke? Heart disease, heart attack, and stroke become more likely as you age. This may be due, in part, to the hormonal changes that your body experiences during menopause. These can affect how your body processes dietary fats, triglycerides, and cholesterol. Heart attack and stroke are both medical emergencies. There are many things that you can do to help prevent heart disease  and stroke:  Have your blood pressure checked at least every 1-2 years. High blood pressure causes heart disease and increases the risk of stroke.  If you are 79-72 years old, ask your health care provider if you should take aspirin to prevent a heart attack or a stroke.  Do not use any tobacco products, including cigarettes, chewing tobacco, or electronic cigarettes. If you need help quitting, ask your health care provider.  It is important to eat a healthy diet and maintain a healthy weight. ? Be sure to include plenty of vegetables, fruits, low-fat dairy products, and lean protein. ? Avoid eating foods that are high in solid fats, added sugars, or salt (sodium).  Get regular exercise. This is one of the most important things that you can do for your health. ? Try to exercise for at least 150 minutes each week. The type of exercise that you do should increase your heart rate and make you sweat. This is known as moderate-intensity exercise. ? Try to do strengthening exercises at least twice each week. Do these in addition to the moderate-intensity exercise.  Know your numbers.Ask your health care provider to check your cholesterol and your blood glucose. Continue to have your blood tested as directed by your health care provider.  What should I know about cancer screening? There are several types of cancer. Take the following steps to reduce your risk and to catch any cancer development as early as possible. Breast Cancer  Practice breast self-awareness. ? This means understanding how your breasts normally appear and feel. ? It also means doing regular breast self-exams. Let your health care provider know about any changes, no matter how small.  If you are 40 or  older, have a clinician do a breast exam (clinical breast exam or CBE) every year. Depending on your age, family history, and medical history, it may be recommended that you also have a yearly breast X-ray (mammogram).  If you  have a family history of breast cancer, talk with your health care provider about genetic screening.  If you are at high risk for breast cancer, talk with your health care provider about having an MRI and a mammogram every year.  Breast cancer (BRCA) gene test is recommended for women who have family members with BRCA-related cancers. Results of the assessment will determine the need for genetic counseling and BRCA1 and for BRCA2 testing. BRCA-related cancers include these types: ? Breast. This occurs in males or females. ? Ovarian. ? Tubal. This may also be called fallopian tube cancer. ? Cancer of the abdominal or pelvic lining (peritoneal cancer). ? Prostate. ? Pancreatic. Cervical, Uterine, and Ovarian Cancer Your health care provider may recommend that you be screened regularly for cancer of the pelvic organs. These include your ovaries, uterus, and vagina. This screening involves a pelvic exam, which includes checking for microscopic changes to the surface of your cervix (Pap test).  For women ages 21-65, health care providers may recommend a pelvic exam and a Pap test every three years. For women ages 39-65, they may recommend the Pap test and pelvic exam, combined with testing for human papilloma virus (HPV), every five years. Some types of HPV increase your risk of cervical cancer. Testing for HPV may also be done on women of any age who have unclear Pap test results.  Other health care providers may not recommend any screening for nonpregnant women who are considered low risk for pelvic cancer and have no symptoms. Ask your health care provider if a screening pelvic exam is right for you.  If you have had past treatment for cervical cancer or a condition that could lead to cancer, you need Pap tests and screening for cancer for at least 20 years after your treatment. If Pap tests have been discontinued for you, your risk factors (such as having a new sexual partner) need to be reassessed  to determine if you should start having screenings again. Some women have medical problems that increase the chance of getting cervical cancer. In these cases, your health care provider may recommend that you have screening and Pap tests more often.  If you have a family history of uterine cancer or ovarian cancer, talk with your health care provider about genetic screening.  If you have vaginal bleeding after reaching menopause, tell your health care provider.  There are currently no reliable tests available to screen for ovarian cancer. Lung Cancer Lung cancer screening is recommended for adults 57-50 years old who are at high risk for lung cancer because of a history of smoking. A yearly low-dose CT scan of the lungs is recommended if you:  Currently smoke.  Have a history of at least 30 pack-years of smoking and you currently smoke or have quit within the past 15 years. A pack-year is smoking an average of one pack of cigarettes per day for one year. Yearly screening should:  Continue until it has been 15 years since you quit.  Stop if you develop a health problem that would prevent you from having lung cancer treatment. Colorectal Cancer  This type of cancer can be detected and can often be prevented.  Routine colorectal cancer screening usually begins at age 12 and continues through  age 63.  If you have risk factors for colon cancer, your health care provider may recommend that you be screened at an earlier age.  If you have a family history of colorectal cancer, talk with your health care provider about genetic screening.  Your health care provider may also recommend using home test kits to check for hidden blood in your stool.  A small camera at the end of a tube can be used to examine your colon directly (sigmoidoscopy or colonoscopy). This is done to check for the earliest forms of colorectal cancer.  Direct examination of the colon should be repeated every 5-10 years until  age 75. However, if early forms of precancerous polyps or small growths are found or if you have a family history or genetic risk for colorectal cancer, you may need to be screened more often. Skin Cancer  Check your skin from head to toe regularly.  Monitor any moles. Be sure to tell your health care provider: ? About any new moles or changes in moles, especially if there is a change in a mole's shape or color. ? If you have a mole that is larger than the size of a pencil eraser.  If any of your family members has a history of skin cancer, especially at a young age, talk with your health care provider about genetic screening.  Always use sunscreen. Apply sunscreen liberally and repeatedly throughout the day.  Whenever you are outside, protect yourself by wearing long sleeves, pants, a wide-brimmed hat, and sunglasses. What should I know about osteoporosis? Osteoporosis is a condition in which bone destruction happens more quickly than new bone creation. After menopause, you may be at an increased risk for osteoporosis. To help prevent osteoporosis or the bone fractures that can happen because of osteoporosis, the following is recommended:  If you are 59-59 years old, get at least 1,000 mg of calcium and at least 600 mg of vitamin D per day.  If you are older than age 36 but younger than age 32, get at least 1,200 mg of calcium and at least 600 mg of vitamin D per day.  If you are older than age 47, get at least 1,200 mg of calcium and at least 800 mg of vitamin D per day. Smoking and excessive alcohol intake increase the risk of osteoporosis. Eat foods that are rich in calcium and vitamin D, and do weight-bearing exercises several times each week as directed by your health care provider. What should I know about how menopause affects my mental health? Depression may occur at any age, but it is more common as you become older. Common symptoms of depression include:  Low or sad mood.   Changes in sleep patterns.  Changes in appetite or eating patterns.  Feeling an overall lack of motivation or enjoyment of activities that you previously enjoyed.  Frequent crying spells. Talk with your health care provider if you think that you are experiencing depression. What should I know about immunizations? It is important that you get and maintain your immunizations. These include:  Tetanus, diphtheria, and pertussis (Tdap) booster vaccine.  Influenza every year before the flu season begins.  Pneumonia vaccine.  Shingles vaccine. Your health care provider may also recommend other immunizations. This information is not intended to replace advice given to you by your health care provider. Make sure you discuss any questions you have with your health care provider. Document Released: 07/02/2005 Document Revised: 11/28/2015 Document Reviewed: 02/11/2015 Elsevier Interactive Patient Education  2019 [place].

## 2018-11-15 ENCOUNTER — Other Ambulatory Visit: Payer: Self-pay

## 2018-11-15 ENCOUNTER — Ambulatory Visit (INDEPENDENT_AMBULATORY_CARE_PROVIDER_SITE_OTHER): Payer: Managed Care, Other (non HMO) | Admitting: Gastroenterology

## 2018-11-15 ENCOUNTER — Encounter: Payer: Self-pay | Admitting: Gastroenterology

## 2018-11-15 DIAGNOSIS — K523 Indeterminate colitis: Secondary | ICD-10-CM

## 2018-11-15 LAB — CYTOLOGY - PAP
Diagnosis: NEGATIVE
HPV: NOT DETECTED

## 2018-11-15 MED ORDER — BUDESONIDE 3 MG PO CPEP: 9.0000 mg | ORAL_CAPSULE | Freq: Every day | ORAL | 1 refills | Status: AC

## 2018-11-15 NOTE — Progress Notes (Signed)
Sherri Sear, MD [address]  [place]  [place], [place] [address]  Main: [phone]  Fax: [phone]    Gastroenterology Consultation Video Visit  Referring Provider:     Florian Buff* Primary Care Physician:  Mar Daring, PA-C Primary Gastroenterologist:  Dr. Cephas Darby Reason for Consultation:    Indeterminate colitis        HPI:   Kristin [name] is a 58 y.o. female referred by Dr. Marlyn Corporal, Clearnce Sorrel, PA-C  for consultation & management of indeterminate colitis  Virtual Visit Video Note  I connected with [name] on 11/15/18 at  9:15 AM EDT by video and verified that I am speaking with the correct person using two identifiers.   I discussed the limitations, risks, security and privacy concerns of performing an evaluation and management service by video and the availability of in person appointments. I also discussed with the patient that there may be a patient responsible charge related to this service. The patient expressed understanding and agreed to proceed.  Location of the Patient: Home  Location of the provider: Home office  Persons participating in the visit: Patient and provider   History of Present Illness: Ms. Momina is here for follow-up of indeterminate colitis.  She underwent colonoscopy in 06/2017 and found to have small polyp and normal colon mucosa.  Random biopsies revealed mild active colitis with background of chronicity.  Empirically started her on budesonide due to ongoing diarrhea.  Currently, her diarrhea has resolved and maintained on budesonide 1 pill a day  She recently found to have iron deficiency but not anemia.  Currently taking iron pills.  She has been donating blood regularly at [facility] within last 3 months.  She does not have any GI concerns today  NSAIDs: None  Antiplts/Anticoagulants/Anti thrombotics: None  GI Procedures:  Colonoscopy 06/2017 - Two 6 mm polyps in the descending colon and  in the transverse colon, removed with a cold snare. Resected and retrieved. Normal colon mucosa, random biopsies performed  DIAGNOSIS:  A. COLON POLYP, DESCENDING; COLD SNARE:  - MILD ACTIVE COLITIS, SEE NOTE.  - NEGATIVE FOR DYSPLASIA AND MALIGNANCY.   B. RANDOM COLON; COLD BIOPSY:  - MILD ACTIVE COLITIS, SEE NOTE.  - NEGATIVE FOR DYSPLASIA AND MALIGNANCY.   C. COLON POLYP, TRANSVERSE; COLD SNARE:  - TUBULAR ADENOMA.  - NEGATIVE FOR HIGH-GRADE DYSPLASIA AND MALIGNANCY.     Past Medical History:  Diagnosis Date  . Anxiety   . Depression   . Diabetes mellitus without complication ([place])    type 1  . Elevated cholesterol   . Hypertension   . Hypothyroidism     Past Surgical History:  Procedure Laterality Date  . BREAST BIOPSY Left    bx/clip-neg  . 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 CARPAL TUNNEL RELEASE Left 11/06/2015   Procedure: CARPAL TUNNEL RELEASE;  Surgeon: Hessie Knows, MD;  Location: [facility] ORS;  Service: Orthopedics;  Laterality: Left;  . COLONOSCOPY WITH PROPOFOL N/A 07/14/2017   Procedure: COLONOSCOPY WITH PROPOFOL;  Surgeon: Lin Landsman, MD;  Location: [facility] ENDOSCOPY;  Service: Gastroenterology;  Laterality: N/A;  . ctr Right   . HAND SURGERY Right 2010   carpal tunnel  . THYROID LOBECTOMY Left 03/2000    Current Outpatient Medications:  .  ALPRAZolam (XANAX) 0.5 MG tablet, TAKE 1 TABLET (0.5 MG TOTAL) BY MOUTH 2 (TWO) TIMES DAILY AS NEEDED FOR ANXIETY., Disp:  60 tablet, Rfl: 5 .  aspirin 81 MG tablet, Take 81 mg by mouth daily., Disp: , Rfl:  .  atorvastatin (LIPITOR) 40 MG tablet, TAKE 1 TABLET BY MOUTH AT  BEDTIME, Disp: 90 tablet, Rfl: 1 .  budesonide (ENTOCORT EC) 3 MG 24 hr capsule, Take 3 capsules (9 mg total) by mouth daily., Disp: 270 capsule, Rfl: 1 .  Cholecalciferol (VITAMIN D3) 2000 units TABS, Take 2 capsules by mouth daily., Disp: , Rfl:  .  glucose blood (PRECISION QID TEST) test strip, Use 5  (five) times daily Use as instructed. ACCU CHEK GUIDE TEST STRIPS, Disp: , Rfl:  .  Insulin Glargine (LANTUS SOLOSTAR) 100 UNIT/ML Solostar Pen, Inject 44 Units into the skin daily at 10 pm. , Disp: , Rfl:  .  Insulin Lispro (HUMALOG KWIKPEN [place]), Inject into the skin., Disp: , Rfl:  .  levothyroxine (SYNTHROID, LEVOTHROID) 200 MCG tablet, Take 200 mcg by mouth daily before breakfast., Disp: , Rfl:  .  losartan (COZAAR) 100 MG tablet, TAKE 1 TABLET BY MOUTH  DAILY, Disp: 90 tablet, Rfl: 1 .  metoprolol succinate (TOPROL-XL) 25 MG 24 hr tablet, TAKE 1 TABLET BY MOUTH  DAILY, Disp: 90 tablet, Rfl: 1 .  sertraline (ZOLOFT) 100 MG tablet, TAKE 1 AND 1/2 TABLETS BY  MOUTH DAILY, Disp: 135 tablet, Rfl: 1 .  loperamide (IMODIUM A-D) 2 MG tablet, Take 1 tablet (2 mg total) by mouth 4 (four) times daily as needed for diarrhea or loose stools. (Patient not taking: Reported on 11/15/2018), Disp: 30 tablet, Rfl: 0 .  Loperamide HCl (IMODIUM A-D) 1 MG/7.5ML LIQD, Take by mouth., Disp: , Rfl:    Family History  Problem Relation Age of Onset  . Alcohol abuse Mother   . Anxiety disorder Mother   . Hypothyroidism Mother   . Alcohol abuse Sister   . Anxiety disorder Sister   . Bipolar disorder Sister   . Alcohol abuse Brother   . Depression Maternal Grandmother   . Stroke Maternal Grandmother   . Alcohol abuse Maternal Grandfather   . Heart disease Maternal Grandfather   . Healthy Brother   . Breast cancer Neg Hx      Social History   Tobacco Use  . Smoking status: Former Smoker    Packs/day: 1.00    Years: 33.00    Pack years: 33.00    Types: Cigarettes    Quit date: 05/24/2008    Years since quitting: 10.4  . Smokeless tobacco: Never Used  Substance Use Topics  . Alcohol use: Yes    Comment: Occasional alcohol use  . Drug use: No    Allergies as of 11/15/2018 - Review Complete 11/15/2018  Allergen Reaction Noted  . Asacol  [mesalamine]  01/30/2015  . Lisinopril Cough 01/30/2015      Imaging Studies: No recent abdominal imaging  Assessment and Plan:   Adna [name] is a 58 y.o. female with metabolic syndrome, hypothyroidism here for follow-up of indeterminate colitis currently maintained in remission on budesonide 3 mg 1 pill a day  Continue budesonide Recommend checking celiac serologies   Follow Up Instructions:   I discussed the assessment and treatment plan with the patient. The patient was provided an opportunity to ask questions and all were answered. The patient agreed with the plan and demonstrated an understanding of the instructions.   The patient was advised to call back or seek an in-person evaluation if the symptoms worsen or if the condition fails to improve as  anticipated.  I provided 15 minutes of face-to-face time during this encounter.   Follow up in 6 to 12 months   Cephas Darby, MD

## 2018-11-16 ENCOUNTER — Telehealth: Payer: Self-pay

## 2018-11-16 NOTE — Telephone Encounter (Signed)
Patient advised as directed below. 

## 2018-11-16 NOTE — Telephone Encounter (Signed)
-----   Message from Mar Daring, [place] sent at 11/15/2018  5:43 PM EDT ----- Pap is normal, HPV negative.  Will repeat in 3-5 years.

## 2018-11-20 LAB — IRON,TIBC AND FERRITIN PANEL
Ferritin: 40 ng/mL (ref 15–150)
Iron Saturation: 22 % (ref 15–55)
Iron: 59 ug/dL (ref 27–159)
Total Iron Binding Capacity: 271 ug/dL (ref 250–450)
UIBC: 212 ug/dL (ref 131–425)

## 2018-11-20 LAB — CBC WITH DIFFERENTIAL/PLATELET
Basophils Absolute: 0.1 10*3/uL (ref 0.0–0.2)
Basos: 1 %
EOS (ABSOLUTE): 0.1 10*3/uL (ref 0.0–0.4)
Eos: 3 %
Hematocrit: 40 % (ref 34.0–46.6)
Hemoglobin: 13.3 g/dL (ref 11.1–15.9)
Immature Grans (Abs): 0 10*3/uL (ref 0.0–0.1)
Immature Granulocytes: 0 %
Lymphocytes Absolute: 2.1 10*3/uL (ref 0.7–3.1)
Lymphs: 41 %
MCH: 31.8 pg (ref 26.6–33.0)
MCHC: 33.3 g/dL (ref 31.5–35.7)
MCV: 96 fL (ref 79–97)
Monocytes Absolute: 0.5 10*3/uL (ref 0.1–0.9)
Monocytes: 11 %
Neutrophils Absolute: 2.2 10*3/uL (ref 1.4–7.0)
Neutrophils: 44 %
Platelets: 178 10*3/uL (ref 150–450)
RBC: 4.18 x10E6/uL (ref 3.77–5.28)
RDW: 12.4 % (ref 11.7–15.4)
WBC: 5 10*3/uL (ref 3.4–10.8)

## 2018-11-20 LAB — HEPATIC FUNCTION PANEL
ALT: 24 IU/L (ref 0–32)
AST: 18 IU/L (ref 0–40)
Albumin: 3.8 g/dL (ref 3.8–4.9)
Alkaline Phosphatase: 64 IU/L (ref 39–117)
Bilirubin Total: 0.2 mg/dL (ref 0.0–1.2)
Bilirubin, Direct: 0.07 mg/dL (ref 0.00–0.40)
Total Protein: 6 g/dL (ref 6.0–8.5)

## 2018-11-20 LAB — VITAMIN D 25 HYDROXY (VIT D DEFICIENCY, FRACTURES): Vit D, 25-Hydroxy: 52.1 ng/mL (ref 30.0–100.0)

## 2018-11-20 LAB — LIPID PANEL
Chol/HDL Ratio: 2.4 ratio (ref 0.0–4.4)
Cholesterol, Total: 163 mg/dL (ref 100–199)
HDL: 68 mg/dL (ref >39)
LDL Calculated: 77 mg/dL (ref 0–99)
Triglycerides: 88 mg/dL (ref 0–149)
VLDL Cholesterol Cal: 18 mg/dL (ref 5–40)

## 2018-12-25 ENCOUNTER — Other Ambulatory Visit: Payer: Self-pay

## 2018-12-25 ENCOUNTER — Telehealth: Payer: Self-pay | Admitting: Gastroenterology

## 2018-12-25 DIAGNOSIS — K523 Indeterminate colitis: Secondary | ICD-10-CM

## 2018-12-25 NOTE — Telephone Encounter (Signed)
Brittney from [facility] is calling pt is there trying to get Labs but she does not see the order please release or fax them to [phone]

## 2018-12-25 NOTE — Telephone Encounter (Signed)
Labs have been released.

## 2018-12-26 LAB — IGA: IgA/Immunoglobulin A, Serum: 210 mg/dL (ref 87–352)

## 2018-12-27 LAB — TISSUE TRANSGLUTAMINASE, IGA: Transglutaminase IgA: <2 U/mL (ref 0–3)

## 2018-12-28 ENCOUNTER — Encounter: Payer: Self-pay | Admitting: Gastroenterology

## 2018-12-29 ENCOUNTER — Other Ambulatory Visit: Payer: Self-pay | Admitting: Gastroenterology

## 2018-12-29 DIAGNOSIS — K5282 Eosinophilic colitis: Secondary | ICD-10-CM

## 2018-12-29 MED ORDER — MESALAMINE 1.2 G PO TBEC: 2.4000 g | DELAYED_RELEASE_TABLET | Freq: Two times a day (BID) | ORAL | 1 refills | Status: DC

## 2019-01-15 ENCOUNTER — Other Ambulatory Visit: Payer: Self-pay | Admitting: Physician Assistant

## 2019-01-15 DIAGNOSIS — F3341 Major depressive disorder, recurrent, in partial remission: Secondary | ICD-10-CM

## 2019-01-20 ENCOUNTER — Encounter: Payer: Self-pay | Admitting: Gastroenterology

## 2019-01-22 ENCOUNTER — Other Ambulatory Visit: Payer: Self-pay

## 2019-01-22 ENCOUNTER — Ambulatory Visit (INDEPENDENT_AMBULATORY_CARE_PROVIDER_SITE_OTHER): Payer: Managed Care, Other (non HMO) | Admitting: Physician Assistant

## 2019-01-22 VITALS — Temp 97.2°F

## 2019-01-22 DIAGNOSIS — Z23 Encounter for immunization: Secondary | ICD-10-CM | POA: Diagnosis not present

## 2019-01-22 NOTE — Progress Notes (Signed)
Shingrix Vaccine #2 given to patient without complications. Patient sat for 15 minutes after administration and was tolerated well without adverse effects. 

## 2019-01-27 ENCOUNTER — Other Ambulatory Visit: Payer: Self-pay | Admitting: Physician Assistant

## 2019-01-27 DIAGNOSIS — I10 Essential (primary) hypertension: Secondary | ICD-10-CM

## 2019-01-27 DIAGNOSIS — E7800 Pure hypercholesterolemia, unspecified: Secondary | ICD-10-CM

## 2019-04-07 ENCOUNTER — Other Ambulatory Visit: Payer: Self-pay | Admitting: Physician Assistant

## 2019-04-07 DIAGNOSIS — F3341 Major depressive disorder, recurrent, in partial remission: Secondary | ICD-10-CM

## 2019-05-24 ENCOUNTER — Other Ambulatory Visit: Payer: Self-pay | Admitting: Physician Assistant

## 2019-05-24 DIAGNOSIS — F419 Anxiety disorder, unspecified: Secondary | ICD-10-CM

## 2019-05-24 NOTE — Telephone Encounter (Signed)
Requested medication (s) are due for refill today: yes  Requested medication (s) are on the active medication list: yes  Last refill:  02/06/2019  Future visit scheduled:yes  Notes to clinic:  This refill cannot be delegated    Requested Prescriptions  Pending Prescriptions Disp Refills   ALPRAZolam (XANAX) 0.5 MG tablet [Pharmacy Med Name: ALPRAZOLAM 0.5 MG TABLET] 60 tablet     Sig: TAKE 1 TABLET (0.5 MG TOTAL) BY MOUTH 2 (TWO) TIMES DAILY AS NEEDED FOR ANXIETY.      Not Delegated - Psychiatry:  Anxiolytics/Hypnotics Failed - 05/24/2019 11:39 AM      Failed - This refill cannot be delegated      Failed - Urine Drug Screen completed in last 360 days.      Passed - Valid encounter within last 6 months    Recent Outpatient Visits           4 months ago Need for shingles vaccine   [facility] [place], Clearnce Sorrel, [place]   6 months ago Annual physical exam   [place], Clearnce Sorrel, [place]   1 year ago Annual physical exam   [facility] [place] Malling M, [place]   2 years ago Benign essential HTN   [place], Clearnce Sorrel, [place]   2 years ago Annual physical exam   [place], Clearnce Sorrel, [place]       Future Appointments             In 5 months [place], Clearnce Sorrel, PA-C [employer], [place]

## 2019-09-03 LAB — HEMOGLOBIN A1C: Hemoglobin A1C: 7.4

## 2019-09-16 ENCOUNTER — Other Ambulatory Visit: Payer: Self-pay | Admitting: Physician Assistant

## 2019-09-16 DIAGNOSIS — F3341 Major depressive disorder, recurrent, in partial remission: Secondary | ICD-10-CM

## 2019-09-17 NOTE — Telephone Encounter (Signed)
Requested Prescriptions  Pending Prescriptions Disp Refills  . sertraline (ZOLOFT) 100 MG tablet [Pharmacy Med Name: SERTRALINE HCL 100MG  TABLET] 135 tablet 3    Sig: TAKE 1 AND 1/2 TABLETS BY  MOUTH DAILY     Psychiatry:  Antidepressants - SSRI Failed - 09/16/2019 11:36 PM      Failed - Valid encounter within last 6 months    Recent Outpatient Visits          7 months ago Need for shingles vaccine   [facility] F[place] Malling M, [place]   10 months ago Annual physical exam   [place], Clearnce Sorrel, [place]   1 year ago Annual physical exam   [facility] Fenton Malling M, [place]   2 years ago Benign essential HTN   [place], Clearnce Sorrel, [place]   2 years ago Annual physical exam   [place], [place]             Passed - Completed PHQ-2 or PHQ-9 in the last 360 days.

## 2019-11-14 ENCOUNTER — Encounter: Payer: Managed Care, Other (non HMO) | Admitting: Physician Assistant

## 2019-11-19 ENCOUNTER — Other Ambulatory Visit: Payer: Self-pay

## 2019-11-19 ENCOUNTER — Encounter: Payer: Self-pay | Admitting: Physician Assistant

## 2019-11-19 ENCOUNTER — Ambulatory Visit (INDEPENDENT_AMBULATORY_CARE_PROVIDER_SITE_OTHER): Payer: Managed Care, Other (non HMO) | Admitting: Physician Assistant

## 2019-11-19 VITALS — BP 143/77 | HR 85 | Temp 97.0°F | Resp 16 | Ht 68.0 in | Wt 263.8 lb

## 2019-11-19 DIAGNOSIS — Z0000 Encounter for general adult medical examination without abnormal findings: Secondary | ICD-10-CM | POA: Diagnosis not present

## 2019-11-19 DIAGNOSIS — E66813 Obesity, class 3: Secondary | ICD-10-CM

## 2019-11-19 DIAGNOSIS — F3342 Major depressive disorder, recurrent, in full remission: Secondary | ICD-10-CM

## 2019-11-19 DIAGNOSIS — E039 Hypothyroidism, unspecified: Secondary | ICD-10-CM

## 2019-11-19 DIAGNOSIS — E109 Type 1 diabetes mellitus without complications: Secondary | ICD-10-CM | POA: Diagnosis not present

## 2019-11-19 DIAGNOSIS — E7800 Pure hypercholesterolemia, unspecified: Secondary | ICD-10-CM | POA: Diagnosis not present

## 2019-11-19 DIAGNOSIS — Z6841 Body Mass Index (BMI) 40.0 and over, adult: Secondary | ICD-10-CM

## 2019-11-19 DIAGNOSIS — F3341 Major depressive disorder, recurrent, in partial remission: Secondary | ICD-10-CM

## 2019-11-19 DIAGNOSIS — Z1231 Encounter for screening mammogram for malignant neoplasm of breast: Secondary | ICD-10-CM

## 2019-11-19 MED ORDER — SERTRALINE HCL 100 MG PO TABS: 100.0000 mg | ORAL_TABLET | Freq: Every day | ORAL | 3 refills | Status: DC

## 2019-11-19 NOTE — Progress Notes (Signed)
Complete physical exam   Patient: Kristin [name]   DOB: March 15, 1961   59 y.o. Female  MRN: [id number] Visit Date: 11/19/2019  Today's healthcare provider: Mar Daring, PA-C   Chief Complaint  Patient presents with  . Annual Exam   Subjective    Kristin [name] is a 59 y.o. female who presents today for a complete physical exam.  She reports consuming a general diet. The patient does not participate in regular exercise at present. She generally feels well. She reports sleeping well. She does not have additional problems to discuss today.  HPI   Past Medical History:  Diagnosis Date  . Anxiety   . Depression   . Diabetes mellitus without complication ([place])    type 1  . Elevated cholesterol   . Hypertension   . Hypothyroidism    Past Surgical History:  Procedure Laterality Date  . BREAST BIOPSY Left    bx/clip-neg  . [place]   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 CARPAL TUNNEL RELEASE Left 11/06/2015   Procedure: CARPAL TUNNEL RELEASE;  Surgeon: Hessie Knows, MD;  Location: [facility] ORS;  Service: Orthopedics;  Laterality: Left;  . COLONOSCOPY WITH PROPOFOL N/A 07/14/2017   Procedure: COLONOSCOPY WITH PROPOFOL;  Surgeon: Lin Landsman, MD;  Location: [facility] ENDOSCOPY;  Service: Gastroenterology;  Laterality: N/A;  . ctr Right   . HAND SURGERY Right 2010   carpal tunnel  . THYROID LOBECTOMY Left 03/2000   Social History   Socioeconomic History  . Marital status: Widowed    Spouse name: Not on file  . Number of children: 0  . Years of education: College  . Highest education level: Not on file  Occupational History  . Not on file  Tobacco Use  . Smoking status: Former Smoker    Packs/day: 1.00    Years: 33.00    Pack years: 33.00    Types: Cigarettes    Quit date: 05/24/2008    Years since quitting: 11.4  . Smokeless tobacco: Never Used  Substance and Sexual Activity  . Alcohol use: Yes    Comment: Occasional alcohol  use  . Drug use: No  . Sexual activity: Not on file  Other Topics Concern  . Not on file  Social History Narrative  . Not on file   Social Determinants of Health   Financial Resource Strain:   . Difficulty of Paying Living Expenses:   Food Insecurity:   . Worried About Charity fundraiser in the Last Year:   . Arboriculturist in the Last Year:   Transportation Needs:   . Film/video editor (Medical):   Marland Kitchen Lack of Transportation (Non-Medical):   Physical Activity:   . Days of Exercise per Week:   . Minutes of Exercise per Session:   Stress:   . Feeling of Stress :   Social Connections:   . Frequency of Communication with Friends and Family:   . Frequency of Social Gatherings with Friends and Family:   . Attends Religious Services:   . Active Member of Clubs or Organizations:   . Attends Archivist Meetings:   Marland Kitchen Marital Status:   Intimate Partner Violence:   . Fear of Current or Ex-Partner:   . Emotionally Abused:   Marland Kitchen Physically Abused:   . Sexually Abused:    Family Status  Relation Name Status  . Mother  Alive  . Sister  Alive  . Brother  Deceased       MVA  . MGM  Deceased  . MGF  Deceased  . Brother  Alive  . Father Unknown (Not Specified)  . Neg Hx  (Not Specified)   Family History  Problem Relation Age of Onset  . Alcohol abuse Mother   . Anxiety disorder Mother   . Hypothyroidism Mother   . Alcohol abuse Sister   . Anxiety disorder Sister   . Bipolar disorder Sister   . Alcohol abuse Brother   . Depression Maternal Grandmother   . Stroke Maternal Grandmother   . Alcohol abuse Maternal Grandfather   . Heart disease Maternal Grandfather   . Healthy Brother   . Breast cancer Neg Hx    Allergies  Allergen Reactions  . Asacol  [Mesalamine]     Other reaction(s): Abdominal pain  . Lisinopril Cough    Patient Care Team: Rubye Beach as PCP - General (Family Medicine)   Medications: Outpatient Medications Prior to Visit   Medication Sig  . ALPRAZolam (XANAX) 0.5 MG tablet TAKE 1 TABLET (0.5 MG TOTAL) BY MOUTH 2 (TWO) TIMES DAILY AS NEEDED FOR ANXIETY.  Marland Kitchen aspirin 81 MG tablet Take 81 mg by mouth daily.  Marland Kitchen atorvastatin (LIPITOR) 40 MG tablet TAKE 1 TABLET BY MOUTH AT  BEDTIME  . Cholecalciferol (VITAMIN D3) 2000 units TABS Take 2 capsules by mouth daily.  . Insulin Glargine (LANTUS SOLOSTAR) 100 UNIT/ML Solostar Pen Inject 44 Units into the skin daily at 10 pm.   . Insulin Lispro (HUMALOG KWIKPEN [place]) Inject into the skin.  Marland Kitchen levothyroxine (SYNTHROID, LEVOTHROID) 200 MCG tablet Take 200 mcg by mouth daily before breakfast.  . losartan (COZAAR) 100 MG tablet TAKE 1 TABLET BY MOUTH  DAILY  . metoprolol succinate (TOPROL-XL) 25 MG 24 hr tablet TAKE 1 TABLET BY MOUTH  DAILY  . sertraline (ZOLOFT) 100 MG tablet TAKE 1 AND 1/2 TABLETS BY  MOUTH DAILY  . loperamide (IMODIUM A-D) 2 MG tablet Take 1 tablet (2 mg total) by mouth 4 (four) times daily as needed for diarrhea or loose stools. (Patient not taking: Reported on 11/15/2018)  . Loperamide HCl (IMODIUM A-D) 1 MG/7.5ML LIQD Take by mouth.  . mesalamine (LIALDA) 1.2 g EC tablet Take 2 tablets (2.4 g total) by mouth 2 (two) times daily.   No facility-administered medications prior to visit.    Review of Systems  Constitutional: Negative.   HENT: Negative.   Eyes: Negative.   Respiratory: Negative.   Cardiovascular: Negative.   Gastrointestinal: Negative.   Endocrine: Negative.   Genitourinary: Negative.   Musculoskeletal: Negative.   Skin: Negative.   Allergic/Immunologic: Negative.   Neurological: Negative.   Hematological: Negative.   Psychiatric/Behavioral: Negative.     Last CBC Lab Results  Component Value Date   WBC 5.0 11/17/2018   HGB 13.3 11/17/2018   HCT 40.0 11/17/2018   MCV 96 11/17/2018   MCH 31.8 11/17/2018   RDW 12.4 11/17/2018   PLT 178 16/11/3708   Last metabolic panel Lab Results  Component Value Date   GLUCOSE 61 (L)  11/04/2017   NA 140 11/04/2017   K 4.7 11/04/2017   CL 102 11/04/2017   CO2 27 11/04/2017   BUN 14 11/04/2017   CREATININE 0.75 11/04/2017   GFRNONAA 88 11/04/2017   GFRAA 101 11/04/2017   CALCIUM 9.4 11/04/2017   PROT 6.0 11/17/2018   ALBUMIN 3.8 11/17/2018   LABGLOB 2.4 11/04/2017   AGRATIO 1.6 11/04/2017  BILITOT 0.2 11/17/2018   ALKPHOS 64 11/17/2018   AST 18 11/17/2018   ALT 24 11/17/2018   ANIONGAP 6 10/23/2015   Last lipids Lab Results  Component Value Date   CHOL 163 11/17/2018   HDL 68 11/17/2018   LDLCALC 77 11/17/2018   TRIG 88 11/17/2018   CHOLHDL 2.4 11/17/2018   Last hemoglobin A1c Lab Results  Component Value Date   HGBA1C 7.4 09/03/2019      Objective    BP (!) 143/77 (BP Location: Left Arm, Patient Position: Sitting, Cuff Size: Large)   Pulse 85   Temp (!) 97 F (36.1 C) (Temporal)   Resp 16   Ht 5\' 8"  (1.727 m)   Wt 263 lb 12.8 oz (119.7 kg)   BMI 40.11 kg/m  BP Readings from Last 3 Encounters:  11/19/19 (!) 143/77  11/13/18 134/83  11/04/17 120/78   Wt Readings from Last 3 Encounters:  11/19/19 263 lb 12.8 oz (119.7 kg)  11/13/18 259 lb 3.2 oz (117.6 kg)  11/04/17 241 lb 9.6 oz (109.6 kg)      Physical Exam Vitals reviewed.  Constitutional:      General: She is not in acute distress.    Appearance: Normal appearance. She is well-developed and well-groomed. She is obese. She is not ill-appearing or diaphoretic.  HENT:     Head: Normocephalic and atraumatic.     Right Ear: Tympanic membrane, ear canal and external ear normal.     Left Ear: Tympanic membrane, ear canal and external ear normal.  Eyes:     General: No scleral icterus.       Right eye: No discharge.        Left eye: No discharge.     Extraocular Movements: Extraocular movements intact.     Conjunctiva/sclera: Conjunctivae normal.     Pupils: Pupils are equal, round, and reactive to light.  Neck:     Thyroid: No thyromegaly.     Vascular: No carotid bruit or  JVD.     Trachea: No tracheal deviation.  Cardiovascular:     Rate and Rhythm: Normal rate and regular rhythm.     Pulses: Normal pulses.     Heart sounds: Normal heart sounds. No murmur heard.  No friction rub. No gallop.   Pulmonary:     Effort: Pulmonary effort is normal. No respiratory distress.     Breath sounds: Normal breath sounds. No wheezing or rales.  Chest:     Chest wall: No tenderness.  Abdominal:     General: Abdomen is flat. Bowel sounds are normal. There is no distension.     Palpations: Abdomen is soft. There is no mass.     Tenderness: There is no abdominal tenderness. There is no guarding or rebound.  Musculoskeletal:        General: No tenderness. Normal range of motion.     Cervical back: Normal range of motion and neck supple.     Right lower leg: No edema.     Left lower leg: No edema.  Lymphadenopathy:     Cervical: No cervical adenopathy.  Skin:    General: Skin is warm and dry.     Capillary Refill: Capillary refill takes less than 2 seconds.     Findings: No rash.  Neurological:     General: No focal deficit present.     Mental Status: She is alert and oriented to person, place, and time. Mental status is at baseline.  Psychiatric:  Mood and Affect: Mood normal.        Behavior: Behavior normal. Behavior is cooperative.        Thought Content: Thought content normal.        Judgment: Judgment normal.     Diabetic Foot Exam - Simple   Simple Foot Form Diabetic Foot exam was performed with the following findings: Yes 11/19/2019  8:55 AM  Visual Inspection No deformities, no ulcerations, no other skin breakdown bilaterally: Yes Sensation Testing Intact to touch and monofilament testing bilaterally: Yes Pulse Check Posterior Tibialis and Dorsalis pulse intact bilaterally: Yes Comments     Last depression screening scores PHQ 2/9 Scores 11/19/2019 11/13/2018 11/04/2017  PHQ - 2 Score 0 1 0  PHQ- 9 Score 0 2 -   Last fall risk  screening Fall Risk  11/04/2017  Falls in the past year? No   Last Audit-C alcohol use screening Alcohol Use Disorder Test (AUDIT) 11/19/2019  1. How often do you have a drink containing alcohol? 2  2. How many drinks containing alcohol do you have on a typical day when you are drinking? 0  3. How often do you have six or more drinks on one occasion? 0  AUDIT-C Score 2  Alcohol Brief Interventions/Follow-up AUDIT Score <7 follow-up not indicated   A score of 3 or more in women, and 4 or more in men indicates increased risk for alcohol abuse, EXCEPT if all of the points are from question 1   No results found for any visits on 11/19/19.  Assessment & Plan    Routine Health Maintenance and Physical Exam  Exercise Activities and Dietary recommendations Goals   None     Immunization History  Administered Date(s) Administered  . H1N1 08/01/2008  . Influenza Split 03/31/2012, 04/14/2015  . Influenza, High Dose Seasonal PF 05/15/2018  . Influenza,inj,Quad PF,6+ Mos 04/17/2013, 04/26/2014, 05/15/2018  . Influenza-Unspecified 04/17/2013, 04/26/2014, 02/19/2016, 02/11/2017  . Pneumococcal Polysaccharide-23 04/17/2013  . Tdap 01/14/2011  . Zoster 05/15/2013  . Zoster Recombinat (Shingrix) 11/13/2018, 01/22/2019    Health Maintenance  Topic Date Due  . FOOT EXAM  Never done  . COVID-19 Vaccine (1) Never done  . OPHTHALMOLOGY EXAM  04/23/2017  . HEMOGLOBIN A1C  05/01/2019  . INFLUENZA VACCINE  12/23/2019  . MAMMOGRAM  07/25/2020  . TETANUS/TDAP  01/13/2021  . PAP SMEAR-Modifier  11/12/2021  . COLONOSCOPY  07/14/2022  . PNEUMOCOCCAL POLYSACCHARIDE VACCINE AGE 30-64 HIGH RISK  Completed  . Hepatitis C Screening  Completed  . HIV Screening  Completed    Discussed health benefits of physical activity, and encouraged her to engage in regular exercise appropriate for her age and condition.  1. Annual physical exam Normal physical exam today. Will check labs as below and f/u pending  lab results. If labs are stable and WNL she will not need to have these rechecked for one year at her next annual physical exam. She is to call the office in the meantime if she has any acute issue, questions or concerns. - CBC with Differential - Comprehensive metabolic panel  2. Breast cancer screening by mammogram There is no family history of breast cancer. She does perform regular self breast exams. Mammogram was ordered as below. Information for [facility] clinic was given to patient so she may schedule her mammogram at her convenience. - MM 3D Screening Bilateral; Future  3. Hypercholesteremia Stable on atorvastatin 40mg . Will check labs as below and f/u pending results. - Lipid panel  4.  Type 1 diabetes mellitus without complication (HCC) Stable. Last A1c was 7.4. Followed by Dr.Solum, endocrinology, every 6 months. - CBC with Differential - Comprehensive metabolic panel  5. Acquired hypothyroidism Stable. Continue levothyroxine 226mcg as directed by Endocrinology.   6. Class 3 severe obesity due to excess calories with serious comorbidity and body mass index (BMI) of 40.0 to 44.9 in adult [facility]) Counseled patient on healthy lifestyle modifications including dieting and exercise. On Ozempic and has restarted weight watchers program again. Down 12 pounds since starting.  7. Recurrent major depressive disorder, in full remission (HCC) Stable on Sertraline 100 mg.    No follow-ups on file.     Reynolds Bowl, PA-C, have reviewed all documentation for this visit. The documentation on 11/19/19 for the exam, diagnosis, procedures, and orders are all accurate and complete.   Rubye Beach  [facility] [phone] (phone) [phone] (fax)  [place]

## 2019-11-19 NOTE — Patient Instructions (Signed)
Health Maintenance, Female Adopting a healthy lifestyle and getting preventive care are important in promoting health and wellness. Ask your health care provider about:  The right schedule for you to have regular tests and exams.  Things you can do on your own to prevent diseases and keep yourself healthy. What should I know about diet, weight, and exercise? Eat a healthy diet   Eat a diet that includes plenty of vegetables, fruits, low-fat dairy products, and lean protein.  Do not eat a lot of foods that are high in solid fats, added sugars, or sodium. Maintain a healthy weight Body mass index (BMI) is used to identify weight problems. It estimates body fat based on height and weight. Your health care provider can help determine your BMI and help you achieve or maintain a healthy weight. Get regular exercise Get regular exercise. This is one of the most important things you can do for your health. Most adults should:  Exercise for at least 150 minutes each week. The exercise should increase your heart rate and make you sweat (moderate-intensity exercise).  Do strengthening exercises at least twice a week. This is in addition to the moderate-intensity exercise.  Spend less time sitting. Even light physical activity can be beneficial. Watch cholesterol and blood lipids Have your blood tested for lipids and cholesterol at 61 years of age, then have this test every 5 years. Have your cholesterol levels checked more often if:  Your lipid or cholesterol levels are high.  You are older than 61 years of age.  You are at high risk for heart disease. What should I know about cancer screening? Depending on your health history and family history, you may need to have cancer screening at various ages. This may include screening for:  Breast cancer.  Cervical cancer.  Colorectal cancer.  Skin cancer.  Lung cancer. What should I know about heart disease, diabetes, and high blood  pressure? Blood pressure and heart disease  High blood pressure causes heart disease and increases the risk of stroke. This is more likely to develop in people who have high blood pressure readings, are of African descent, or are overweight.  Have your blood pressure checked: ? Every 3-5 years if you are 18-39 years of age. ? Every year if you are 40 years old or older. Diabetes Have regular diabetes screenings. This checks your fasting blood sugar level. Have the screening done:  Once every three years after age 40 if you are at a normal weight and have a low risk for diabetes.  More often and at a younger age if you are overweight or have a high risk for diabetes. What should I know about preventing infection? Hepatitis B If you have a higher risk for hepatitis B, you should be screened for this virus. Talk with your health care provider to find out if you are at risk for hepatitis B infection. Hepatitis C Testing is recommended for:  Everyone born from 1945 through 1965.  Anyone with known risk factors for hepatitis C. Sexually transmitted infections (STIs)  Get screened for STIs, including gonorrhea and chlamydia, if: ? You are sexually active and are younger than 61 years of age. ? You are older than 61 years of age and your health care provider tells you that you are at risk for this type of infection. ? Your sexual activity has changed since you were last screened, and you are at increased risk for chlamydia or gonorrhea. Ask your health care provider if   you are at risk.  Ask your health care provider about whether you are at high risk for HIV. Your health care provider may recommend a prescription medicine to help prevent HIV infection. If you choose to take medicine to prevent HIV, you should first get tested for HIV. You should then be tested every 3 months for as long as you are taking the medicine. Pregnancy  If you are about to stop having your period (premenopausal) and  you may become pregnant, seek counseling before you get pregnant.  Take 400 to 800 micrograms (mcg) of folic acid every day if you become pregnant.  Ask for birth control (contraception) if you want to prevent pregnancy. Osteoporosis and menopause Osteoporosis is a disease in which the bones lose minerals and strength with aging. This can result in bone fractures. If you are 65 years old or older, or if you are at risk for osteoporosis and fractures, ask your health care provider if you should:  Be screened for bone loss.  Take a calcium or vitamin D supplement to lower your risk of fractures.  Be given hormone replacement therapy (HRT) to treat symptoms of menopause. Follow these instructions at home: Lifestyle  Do not use any products that contain nicotine or tobacco, such as cigarettes, e-cigarettes, and chewing tobacco. If you need help quitting, ask your health care provider.  Do not use street drugs.  Do not share needles.  Ask your health care provider for help if you need support or information about quitting drugs. Alcohol use  Do not drink alcohol if: ? Your health care provider tells you not to drink. ? You are pregnant, may be pregnant, or are planning to become pregnant.  If you drink alcohol: ? Limit how much you use to 0-1 drink a day. ? Limit intake if you are breastfeeding.  Be aware of how much alcohol is in your drink. In the [place] one drink equals one 12 oz bottle of beer (355 mL), one 5 oz glass of wine (148 mL), or one 1 oz glass of hard liquor (44 mL). General instructions  Schedule regular health, dental, and eye exams.  Stay current with your vaccines.  Tell your health care provider if: ? You often feel depressed. ? You have ever been abused or do not feel safe at home. Summary  Adopting a healthy lifestyle and getting preventive care are important in promoting health and wellness.  Follow your health care provider's instructions about healthy  diet, exercising, and getting tested or screened for diseases.  Follow your health care provider's instructions on monitoring your cholesterol and blood pressure. This information is not intended to replace advice given to you by your health care provider. Make sure you discuss any questions you have with your health care provider. Document Revised: 05/03/2018 Document Reviewed: 05/03/2018 Elsevier Patient Education  2020 Elsevier Inc.  

## 2019-11-21 ENCOUNTER — Telehealth: Payer: Self-pay

## 2019-11-21 LAB — COMPREHENSIVE METABOLIC PANEL
ALT: 20 IU/L (ref 0–32)
AST: 17 IU/L (ref 0–40)
Albumin/Globulin Ratio: 2 (ref 1.2–2.2)
Albumin: 4.3 g/dL (ref 3.8–4.8)
Alkaline Phosphatase: 86 IU/L (ref 48–121)
BUN/Creatinine Ratio: 15 (ref 12–28)
BUN: 10 mg/dL (ref 8–27)
Bilirubin Total: 0.5 mg/dL (ref 0.0–1.2)
CO2: 24 mmol/L (ref 20–29)
Calcium: 9.3 mg/dL (ref 8.7–10.3)
Chloride: 101 mmol/L (ref 96–106)
Creatinine, Ser: 0.67 mg/dL (ref 0.57–1.00)
GFR calc Af Amer: 110 mL/min/{1.73_m2} (ref >59)
GFR calc non Af Amer: 95 mL/min/{1.73_m2} (ref >59)
Globulin, Total: 2.1 g/dL (ref 1.5–4.5)
Glucose: 133 mg/dL — ABNORMAL HIGH (ref 65–99)
Potassium: 4.5 mmol/L (ref 3.5–5.2)
Sodium: 140 mmol/L (ref 134–144)
Total Protein: 6.4 g/dL (ref 6.0–8.5)

## 2019-11-21 LAB — CBC WITH DIFFERENTIAL/PLATELET
Basophils Absolute: 0 10*3/uL (ref 0.0–0.2)
Basos: 0 %
EOS (ABSOLUTE): 0.1 10*3/uL (ref 0.0–0.4)
Eos: 2 %
Hematocrit: 43.7 % (ref 34.0–46.6)
Hemoglobin: 14.8 g/dL (ref 11.1–15.9)
Immature Grans (Abs): 0 10*3/uL (ref 0.0–0.1)
Immature Granulocytes: 0 %
Lymphocytes Absolute: 2 10*3/uL (ref 0.7–3.1)
Lymphs: 28 %
MCH: 32.1 pg (ref 26.6–33.0)
MCHC: 33.9 g/dL (ref 31.5–35.7)
MCV: 95 fL (ref 79–97)
Monocytes Absolute: 0.7 10*3/uL (ref 0.1–0.9)
Monocytes: 10 %
Neutrophils Absolute: 4.3 10*3/uL (ref 1.4–7.0)
Neutrophils: 60 %
Platelets: 203 10*3/uL (ref 150–450)
RBC: 4.61 x10E6/uL (ref 3.77–5.28)
RDW: 12.3 % (ref 11.7–15.4)
WBC: 7.2 10*3/uL (ref 3.4–10.8)

## 2019-11-21 LAB — LIPID PANEL
Chol/HDL Ratio: 2.7 ratio (ref 0.0–4.4)
Cholesterol, Total: 156 mg/dL (ref 100–199)
HDL: 58 mg/dL (ref >39)
LDL Chol Calc (NIH): 80 mg/dL (ref 0–99)
Triglycerides: 100 mg/dL (ref 0–149)
VLDL Cholesterol Cal: 18 mg/dL (ref 5–40)

## 2019-11-21 NOTE — Telephone Encounter (Signed)
Pt advised.   Thanks,   -Lynden Carrithers  

## 2019-11-21 NOTE — Telephone Encounter (Signed)
-----   Message from Mar Daring, [place] sent at 11/21/2019 10:25 AM EDT ----- Blood count is normal. Kidney and liver function are normal. Sodium, potassium, and calcium are normal. Cholesterol is perfect and even down compared to last year.

## 2019-11-27 ENCOUNTER — Encounter: Payer: Self-pay | Admitting: Physician Assistant

## 2019-11-28 ENCOUNTER — Ambulatory Visit
Admission: RE | Admit: 2019-11-28 | Discharge: 2019-11-28 | Disposition: A | Discharge status: Home / Self Care | Payer: Managed Care, Other (non HMO) | Source: Ambulatory Visit | Attending: Physician Assistant | Admitting: Physician Assistant

## 2019-11-28 DIAGNOSIS — Z1231 Encounter for screening mammogram for malignant neoplasm of breast: Secondary | ICD-10-CM | POA: Insufficient documentation

## 2019-11-29 ENCOUNTER — Telehealth: Payer: Self-pay

## 2019-11-29 NOTE — Telephone Encounter (Signed)
Normal mammogram. Repeat screening in one year.  Written by Mar Daring, PA-C on 11/29/2019 12:25 PM EDT Seen by patient Claiborne Billings Re-Herriott on 11/29/2019 12:25 PM

## 2019-11-29 NOTE — Telephone Encounter (Signed)
-----   Message from Mar Daring, [place] sent at 11/29/2019 12:25 PM EDT ----- Normal mammogram. Repeat screening in one year.

## 2019-12-12 ENCOUNTER — Other Ambulatory Visit: Payer: Self-pay | Admitting: Physician Assistant

## 2019-12-12 DIAGNOSIS — E7800 Pure hypercholesterolemia, unspecified: Secondary | ICD-10-CM

## 2019-12-12 DIAGNOSIS — I10 Essential (primary) hypertension: Secondary | ICD-10-CM

## 2020-05-17 ENCOUNTER — Other Ambulatory Visit: Payer: Self-pay | Admitting: Physician Assistant

## 2020-05-17 DIAGNOSIS — I10 Essential (primary) hypertension: Secondary | ICD-10-CM

## 2020-05-18 NOTE — Telephone Encounter (Signed)
Requested Prescriptions  Pending Prescriptions Disp Refills  . metoprolol succinate (TOPROL-XL) 25 MG 24 hr tablet [Pharmacy Med Name: Metoprolol Succinate ER 25 MG Oral Tablet Extended Release 24 Hour] 90 tablet 1    Sig: TAKE 1 TABLET BY MOUTH  DAILY     Cardiovascular:  Beta Blockers Failed - 05/17/2020 11:49 AM      Failed - Last BP in normal range    BP Readings from Last 1 Encounters:  11/19/19 (!) 143/77         Passed - Last Heart Rate in normal range    Pulse Readings from Last 1 Encounters:  11/19/19 85         Passed - Valid encounter within last 6 months    Recent Outpatient Visits          6 months ago Annual physical exam   [facility] [place], Clearnce Sorrel, [place]   1 year ago Need for shingles vaccine   [place], Clearnce Sorrel, [place]   1 year ago Annual physical exam   [facility] [place], Clearnce Sorrel, [place]   2 years ago Annual physical exam   [facility] [place] M, [place]   3 years ago Benign essential HTN   [place], Clearnce Sorrel, [place]      Future Appointments            In 6 months [place], Clearnce Sorrel, PA-C [employer], [place]

## 2020-09-13 ENCOUNTER — Other Ambulatory Visit: Payer: Self-pay | Admitting: Physician Assistant

## 2020-09-13 DIAGNOSIS — I10 Essential (primary) hypertension: Secondary | ICD-10-CM

## 2020-09-15 NOTE — Telephone Encounter (Signed)
  Notes to clinic:  Patient has appointment on 11/20/2020 Review for enough medication until that time   Requested Prescriptions  Pending Prescriptions Disp Refills   metoprolol succinate (TOPROL-XL) 25 MG 24 hr tablet [Pharmacy Med Name: Metoprolol Succinate ER 25 MG Oral Tablet Extended Release 24 Hour] 90 tablet 3    Sig: TAKE 1 TABLET BY MOUTH  DAILY      Cardiovascular:  Beta Blockers Failed - 09/13/2020  3:04 PM      Failed - Last BP in normal range    BP Readings from Last 1 Encounters:  11/19/19 (!) 143/77          Failed - Valid encounter within last 6 months    Recent Outpatient Visits           10 months ago Annual physical exam   [facility] [place], Clearnce Sorrel, [place]   1 year ago Need for shingles vaccine   [place], Clearnce Sorrel, [place]   1 year ago Annual physical exam   [place], Clearnce Sorrel, [place]   2 years ago Annual physical exam   [place], [place], [place]   3 years ago Benign essential HTN   [place], Clearnce Sorrel, [place]       Future Appointments             In 2 months Burnette, Clearnce Sorrel, PA-C [employer], [facility]             Passed - Last Heart Rate in normal range    Pulse Readings from Last 1 Encounters:  11/19/19 85

## 2020-10-09 ENCOUNTER — Telehealth: Payer: Self-pay

## 2020-10-09 NOTE — Telephone Encounter (Signed)
Called patient to reschedule appointment from 06/30 with [facility]. Patient prefers not to reschedule because she prefers a "female" doctor or PA. Patient is going to call back in July to schedule appointment with new provider. When patient calls back please schedule her. Thanks.

## 2020-10-23 ENCOUNTER — Telehealth: Payer: Self-pay

## 2020-10-23 NOTE — Telephone Encounter (Signed)
Copied from [place] [id number]. Topic: Appointment Scheduling - Scheduling Inquiry for Clinic >> Oct 23, 2020 11:35 AM Tessa Lerner A wrote: Reason for CRM: Patient would like to be seen by Dr. Brita Romp for a physical   Please contact to further advise when possible

## 2020-11-20 ENCOUNTER — Encounter: Payer: Self-pay | Admitting: Physician Assistant

## 2020-12-28 ENCOUNTER — Other Ambulatory Visit: Payer: Self-pay | Admitting: Physician Assistant

## 2020-12-28 DIAGNOSIS — F3341 Major depressive disorder, recurrent, in partial remission: Secondary | ICD-10-CM

## 2021-01-01 ENCOUNTER — Other Ambulatory Visit: Payer: Self-pay | Admitting: Physician Assistant

## 2021-01-01 ENCOUNTER — Other Ambulatory Visit: Payer: Self-pay | Admitting: Family Medicine

## 2021-01-01 DIAGNOSIS — E7800 Pure hypercholesterolemia, unspecified: Secondary | ICD-10-CM

## 2021-01-01 DIAGNOSIS — I10 Essential (primary) hypertension: Secondary | ICD-10-CM

## 2021-02-02 ENCOUNTER — Other Ambulatory Visit: Payer: Self-pay

## 2021-02-02 ENCOUNTER — Encounter: Payer: Self-pay | Admitting: Family Medicine

## 2021-02-02 ENCOUNTER — Ambulatory Visit (INDEPENDENT_AMBULATORY_CARE_PROVIDER_SITE_OTHER): Payer: Managed Care, Other (non HMO) | Admitting: Family Medicine

## 2021-02-02 VITALS — BP 117/72 | HR 67 | Temp 98.3°F | Resp 15 | Ht 68.0 in | Wt 222.5 lb

## 2021-02-02 DIAGNOSIS — E669 Obesity, unspecified: Secondary | ICD-10-CM

## 2021-02-02 DIAGNOSIS — Z6833 Body mass index (BMI) 33.0-33.9, adult: Secondary | ICD-10-CM

## 2021-02-02 DIAGNOSIS — Z0000 Encounter for general adult medical examination without abnormal findings: Secondary | ICD-10-CM | POA: Diagnosis not present

## 2021-02-02 DIAGNOSIS — E039 Hypothyroidism, unspecified: Secondary | ICD-10-CM

## 2021-02-02 DIAGNOSIS — E1159 Type 2 diabetes mellitus with other circulatory complications: Secondary | ICD-10-CM | POA: Diagnosis not present

## 2021-02-02 DIAGNOSIS — Z23 Encounter for immunization: Secondary | ICD-10-CM

## 2021-02-02 DIAGNOSIS — F3341 Major depressive disorder, recurrent, in partial remission: Secondary | ICD-10-CM

## 2021-02-02 DIAGNOSIS — I152 Hypertension secondary to endocrine disorders: Secondary | ICD-10-CM

## 2021-02-02 DIAGNOSIS — E785 Hyperlipidemia, unspecified: Secondary | ICD-10-CM

## 2021-02-02 DIAGNOSIS — E1069 Type 1 diabetes mellitus with other specified complication: Secondary | ICD-10-CM

## 2021-02-02 MED ORDER — SERTRALINE HCL 100 MG PO TABS: 100.0000 mg | ORAL_TABLET | Freq: Every day | ORAL | 1 refills | Status: DC

## 2021-02-02 MED ORDER — LOSARTAN POTASSIUM 50 MG PO TABS: 50.0000 mg | ORAL_TABLET | Freq: Every day | ORAL | 1 refills | Status: DC

## 2021-02-02 MED ORDER — ATORVASTATIN CALCIUM 40 MG PO TABS: 40.0000 mg | ORAL_TABLET | Freq: Every day | ORAL | 3 refills | Status: DC

## 2021-02-02 NOTE — Assessment & Plan Note (Signed)
-   Chronic, improving since recently starting Weight Watchers - Discussed diet and exercise - Discussed the importance of healthy weight management

## 2021-02-02 NOTE — Assessment & Plan Note (Signed)
-   Chronic and stable - Continue Sertraline - Continue Xanax prn for anxiety attacks

## 2021-02-02 NOTE — Assessment & Plan Note (Signed)
-   Chronic and well-controlled - Continue Lantus - Continue following with endocrinology - Discussed diet & exercise

## 2021-02-02 NOTE — Assessment & Plan Note (Signed)
-   Stable and well-controlled - Continue Losartan & Metoprolol - Encouraged pt. to continue checking BP at home

## 2021-02-02 NOTE — Assessment & Plan Note (Signed)
-   Chronic, previously stable - Continue Atorvastatin - Recheck lipid panel & LFTs

## 2021-02-02 NOTE — Assessment & Plan Note (Addendum)
-   Low TSH noted on labs done at endocrinology in August 22; currently asymptomatic - Continue following with endocrinology - Encouraged pt. to follow up with endocrinology re: low TSH  - We will defer making medication changes at this time; will continue to monitor

## 2021-02-02 NOTE — Progress Notes (Signed)
Annual Wellness Visit     Patient: Ilan Re-Herriott, Female    DOB: 1960/08/15, 60 y.o.   MRN: [id number] Visit Date: 02/02/2021  Today's Provider: Lavon Paganini, MD   Chief Complaint  Patient presents with   Annual Exam   Subjective    Rainbow Re-Herriott is a 60 y.o. female who presents today for her Annual Wellness Visit. She reports consuming a general diet. The patient does not participate in regular exercise at present. She generally feels well. She reports sleeping well. She does not have additional problems to discuss today.   HPI Type 1 Diabetes - Currently taking Lantus; checks BG with CGM - Pt. states that BG at home ranges 140s on average - Following with endocrinology, last A1c was 6.6 in August  Hyperlipidemia - Currently taking Atorvastatin, denies myalgias  Hypertension - Currently Losartan & Metoprolol, denies side effects - Denies chest pain, SOB, leg swelling, & hypotensive episodes  Hypothyroidism - Currently taking Synthroid; following with endocrinology - Low TSH noted on labs drawn by endocrinology in August - Denies heat intolerance, tachycardia, & palpitations  Anxiety & Depression - Currently taking Sertraline - Pt. endorses some occasional "low days" but feels that her symptoms are overall manageable - Uses Xanax "a few times a month" prn for anxiety attacks  Health Maintenance - States that she had an eye exam in 3/22 - need to request records - Pt. reports that she plans to schedule her screening mammo in the next few weeks - Due for new COVID booster, Tdap, & flu vaccines   Medications: Outpatient Medications Prior to Visit  Medication Sig   ALPRAZolam (XANAX) 0.5 MG tablet TAKE 1 TABLET (0.5 MG TOTAL) BY MOUTH 2 (TWO) TIMES DAILY AS NEEDED FOR ANXIETY.   aspirin 81 MG tablet Take 81 mg by mouth daily.   Cholecalciferol (VITAMIN D3) 2000 units TABS Take 2 capsules by mouth daily.   insulin glargine (LANTUS) 100 UNIT/ML  Solostar Pen Inject 44 Units into the skin daily at 10 pm.    levothyroxine (SYNTHROID, LEVOTHROID) 200 MCG tablet Take 200 mcg by mouth daily before breakfast.   metoprolol succinate (TOPROL-XL) 25 MG 24 hr tablet TAKE 1 TABLET BY MOUTH  DAILY   [DISCONTINUED] atorvastatin (LIPITOR) 40 MG tablet TAKE 1 TABLET BY MOUTH AT  BEDTIME   [DISCONTINUED] losartan (COZAAR) 50 MG tablet Take 50 mg by mouth daily.   [DISCONTINUED] sertraline (ZOLOFT) 100 MG tablet Take 1 tablet (100 mg total) by mouth daily.   [DISCONTINUED] Insulin Lispro (HUMALOG KWIKPEN [place]) Inject into the skin.   [DISCONTINUED] losartan (COZAAR) 100 MG tablet TAKE 1 TABLET BY MOUTH  DAILY   No facility-administered medications prior to visit.    Allergies  Allergen Reactions   Asacol  [Mesalamine]     Other reaction(s): Abdominal pain   Lisinopril Cough    Patient Care Team: Gwyneth Sprout, FNP as PCP - General (Family Medicine)  Review of Systems  Constitutional:  Negative for activity change, appetite change, fatigue and fever.  HENT: Negative.    Eyes: Negative.  Negative for visual disturbance.  Respiratory:  Negative for chest tightness and shortness of breath.   Cardiovascular:  Negative for chest pain and leg swelling.  Gastrointestinal: Negative.   Endocrine: Negative for cold intolerance, heat intolerance, polydipsia, polyphagia and polyuria.  Musculoskeletal: Negative.   Skin: Negative.   Psychiatric/Behavioral: Negative.          Objective    Vitals: BP 117/72  Pulse 67   Temp 98.3 F (36.8 C) (Oral)   Resp 15   Ht '5\' 8"'$  (1.727 m)   Wt 222 lb 8 oz (100.9 kg)   BMI 33.83 kg/m     Physical Exam Constitutional:      General: She is not in acute distress.    Appearance: Normal appearance. She is obese.  HENT:     Head: Normocephalic and atraumatic.     Right Ear: External ear normal.     Left Ear: External ear normal.  Eyes:     Conjunctiva/sclera: Conjunctivae normal.  Cardiovascular:      Rate and Rhythm: Normal rate and regular rhythm.     Pulses: Normal pulses.     Heart sounds: Normal heart sounds.  Pulmonary:     Effort: Pulmonary effort is normal.     Breath sounds: Normal breath sounds.  Abdominal:     General: Bowel sounds are normal.     Palpations: Abdomen is soft.  Skin:    General: Skin is warm and dry.  Neurological:     Mental Status: She is alert.  Psychiatric:        Behavior: Behavior normal.        Thought Content: Thought content normal.     Most recent functional status assessment: In your present state of health, do you have any difficulty performing the following activities: 02/02/2021  Hearing? N  Vision? N  Difficulty concentrating or making decisions? N  Walking or climbing stairs? N  Dressing or bathing? N  Doing errands, shopping? N  Some recent data might be hidden   Most recent fall risk assessment: Fall Risk  11/04/2017  Falls in the past year? No    Most recent depression screenings: PHQ 2/9 Scores 02/02/2021 11/19/2019  PHQ - 2 Score 2 0  PHQ- 9 Score 3 0   Most recent cognitive screening: No flowsheet data found. Most recent Audit-C alcohol use screening Alcohol Use Disorder Test (AUDIT) 02/02/2021  1. How often do you have a drink containing alcohol? 3  2. How many drinks containing alcohol do you have on a typical day when you are drinking? 0  3. How often do you have six or more drinks on one occasion? 0  AUDIT-C Score 3  Alcohol Brief Interventions/Follow-up -   A score of 3 or more in women, and 4 or more in men indicates increased risk for alcohol abuse, EXCEPT if all of the points are from question 1   No results found for any visits on 02/02/21.  Assessment & Plan     Annual wellness visit done today including the all of the following: Reviewed patient's Family Medical History Reviewed and updated list of patient's medical providers Assessment of cognitive impairment was done Assessed patient's functional  ability Established a written schedule for health screening [place] Completed and Reviewed  Exercise Activities and Dietary recommendations  Goals   None     Immunization History  Administered Date(s) Administered   H1N1 08/01/2008   Influenza Inj Mdck Quad Pf 03/24/2019   Influenza Split 03/31/2012, 04/14/2015   Influenza, High Dose Seasonal PF 05/15/2018   Influenza,inj,Quad PF,6+ Mos 04/17/2013, 04/26/2014, 05/15/2018   Influenza-Unspecified 04/17/2013, 04/26/2014, 02/19/2016, 02/11/2017   Moderna Sars-Covid-2 Vaccination 08/22/2019, 09/19/2019   Pneumococcal Polysaccharide-23 04/17/2013   Tdap 01/14/2011   Zoster Recombinat (Shingrix) 11/13/2018, 01/22/2019   Zoster, Live 05/15/2013    Health Maintenance  Topic Date Due   OPHTHALMOLOGY EXAM  04/23/2017   HEMOGLOBIN A1C  03/04/2020   COVID-19 Vaccine (4 - Booster for Moderna series) 07/30/2020   INFLUENZA VACCINE  12/22/2020   TETANUS/TDAP  01/13/2021   MAMMOGRAM  11/27/2021   FOOT EXAM  02/02/2022   COLONOSCOPY (Pts 45-16yr Insurance coverage will need to be confirmed)  07/14/2022   PAP SMEAR-Modifier  11/13/2023   PNEUMOCOCCAL POLYSACCHARIDE VACCINE AGE 38-64 HIGH RISK  Completed   Hepatitis C Screening  Completed   HIV Screening  Completed   Zoster Vaccines- Shingrix  Completed   Pneumococcal Vaccine 023620Years old  Aged Out   HPV VACCINES  Aged Out     Discussed health benefits of physical activity, and encouraged her to engage in regular exercise appropriate for her age and condition.    Problem List Items Addressed This Visit       Cardiovascular and Mediastinum   Benign essential HTN    - Stable and well-controlled - Continue Losartan & Metoprolol - Encouraged pt. to continue checking BP at home      Relevant Medications   atorvastatin (LIPITOR) 40 MG tablet   losartan (COZAAR) 50 MG tablet     Endocrine   Type 1 diabetes mellitus without complication (HCC)    - Chronic  and well-controlled - Continue Lantus - Continue following with endocrinology - Discussed diet & exercise      Relevant Medications   atorvastatin (LIPITOR) 40 MG tablet   losartan (COZAAR) 50 MG tablet   Acquired hypothyroidism    - Low TSH noted on labs done at endocrinology in August 22; currently asymptomatic - Continue following with endocrinology - Encouraged pt. to follow up with endocrinology re: low TSH  - We will defer making medication changes at this time; will continue to monitor        Other   Hypercholesteremia    - Chronic, previously stable - Continue Atorvastatin - Recheck lipid panel & LFTs      Relevant Medications   atorvastatin (LIPITOR) 40 MG tablet   losartan (COZAAR) 50 MG tablet   Class 3 severe obesity due to excess calories with serious comorbidity and body mass index (BMI) of 40.0 to 44.9 in adult (HCC)    - Chronic, improving since recently starting Weight Watchers - Discussed diet and exercise - Discussed the importance of healthy weight management      Recurrent major depressive disorder, in partial remission (HCC)    - Chronic and stable - Continue Sertraline - Continue Xanax prn for anxiety attacks      Relevant Medications   sertraline (ZOLOFT) 100 MG tablet   Other Visit Diagnoses     Annual physical exam    -  Primary    - Overall doing well  - Encouraged pt. to schedule screening mammo  - Will request records from most recent eye exam  - Flu vaccine & Tdap booster delivered in office today  - Recommended pt. to receive new COVID booster    Relevant Orders   Lipid Profile   Hepatic function panel   Need for influenza vaccination       Relevant Orders   Flu Vaccine QUAD 641moM (Fluarix, Fluzone & Alfiuria Quad PF)        Return in about 6 months (around 08/02/2021) for chronic disease f/u, With new PCP.      MePercell LocusMS3   Patient seen along with MS3 student MePercell LocusI personally evaluated this  patient along with the student, and verified  all aspects of the history, physical exam, and medical decision making as documented by the student. I agree with the student's documentation and have made all necessary edits.  Tyon Cerasoli, Dionne Bucy, MD, MPH [facility]

## 2021-02-04 ENCOUNTER — Other Ambulatory Visit: Payer: Self-pay | Admitting: Family Medicine

## 2021-02-04 DIAGNOSIS — Z1231 Encounter for screening mammogram for malignant neoplasm of breast: Secondary | ICD-10-CM

## 2021-02-05 LAB — HM DIABETES EYE EXAM

## 2021-02-06 LAB — HEPATIC FUNCTION PANEL
ALT: 27 IU/L (ref 0–32)
AST: 21 IU/L (ref 0–40)
Albumin: 4 g/dL (ref 3.8–4.8)
Alkaline Phosphatase: 77 IU/L (ref 44–121)
Bilirubin Total: 0.3 mg/dL (ref 0.0–1.2)
Bilirubin, Direct: 0.12 mg/dL (ref 0.00–0.40)
Total Protein: 6.1 g/dL (ref 6.0–8.5)

## 2021-02-06 LAB — LIPID PANEL
Chol/HDL Ratio: 2.4 ratio (ref 0.0–4.4)
Cholesterol, Total: 137 mg/dL (ref 100–199)
HDL: 58 mg/dL (ref >39)
LDL Chol Calc (NIH): 63 mg/dL (ref 0–99)
Triglycerides: 84 mg/dL (ref 0–149)
VLDL Cholesterol Cal: 16 mg/dL (ref 5–40)

## 2021-02-17 ENCOUNTER — Other Ambulatory Visit: Payer: Self-pay

## 2021-02-17 ENCOUNTER — Ambulatory Visit
Admission: RE | Admit: 2021-02-17 | Discharge: 2021-02-17 | Disposition: A | Discharge status: Home / Self Care | Payer: Managed Care, Other (non HMO) | Source: Ambulatory Visit | Attending: Family Medicine | Admitting: Family Medicine

## 2021-02-17 DIAGNOSIS — Z1231 Encounter for screening mammogram for malignant neoplasm of breast: Secondary | ICD-10-CM | POA: Diagnosis present

## 2021-05-31 ENCOUNTER — Other Ambulatory Visit: Payer: Self-pay | Admitting: Family Medicine

## 2021-06-01 NOTE — Telephone Encounter (Signed)
Requested medication (s) are due for refill today:   No  Requested medication (s) are on the active medication list:   Yes  Future visit scheduled:   Yes  Had CPE 3 months ago even though protocol indicating she has not had a valid encounter within the last 6 months.   Last ordered: 02/02/2021 #90, 1 refill  Returned because a 1 yr. Supply is being requesting.   Also failed protocol due to labs are over due.     Requested Prescriptions  Pending Prescriptions Disp Refills   losartan (COZAAR) 50 MG tablet [Pharmacy Med Name: Losartan Potassium 50 MG Oral Tablet] 90 tablet 3    Sig: TAKE 1 TABLET BY MOUTH  DAILY     Cardiovascular:  Angiotensin Receptor Blockers Failed - 05/31/2021 10:44 PM      Failed - Cr in normal range and within 180 days    Creatinine, Ser  Date Value Ref Range Status  11/20/2019 0.67 0.57 - 1.00 mg/dL Final          Failed - K in normal range and within 180 days    Potassium  Date Value Ref Range Status  11/20/2019 4.5 3.5 - 5.2 mmol/L Final          Failed - Valid encounter within last 6 months    Recent Outpatient Visits           3 months ago Annual physical exam   [facility] [place], Dionne Bucy, MD   1 year ago Annual physical exam   [facility] [place], [place]   2 years ago Need for shingles vaccine   [place], Clearnce Sorrel, [place]   2 years ago Annual physical exam   [facility] [place], Clearnce Sorrel, [place]   3 years ago Annual physical exam   [place], Clearnce Sorrel, [place]       Future Appointments             In 2 months Bacigalupo, Dionne Bucy, MD [facility] [place], [place] - Patient is not pregnant      Passed - Last BP in normal range    BP Readings from Last 1 Encounters:  02/02/21 117/72

## 2021-06-22 ENCOUNTER — Other Ambulatory Visit: Payer: Self-pay | Admitting: Family Medicine

## 2021-06-22 DIAGNOSIS — F3341 Major depressive disorder, recurrent, in partial remission: Secondary | ICD-10-CM

## 2021-06-23 NOTE — Telephone Encounter (Signed)
Requested Prescriptions  Pending Prescriptions Disp Refills   sertraline (ZOLOFT) 100 MG tablet [Pharmacy Med Name: Sertraline HCl 100 MG Oral Tablet] 90 tablet 3    Sig: TAKE 1 TABLET BY MOUTH  DAILY     Psychiatry:  Antidepressants - SSRI Failed - 06/22/2021 10:30 PM      Failed - Valid encounter within last 6 months    Recent Outpatient Visits          4 months ago Annual physical exam   [facility] [place], Dionne Bucy, MD   1 year ago Annual physical exam   [facility] [place], [place]   2 years ago Need for shingles vaccine   [place], Clearnce Sorrel, [place]   2 years ago Annual physical exam   [facility] [place], Clearnce Sorrel, [place]   3 years ago Annual physical exam   [place], [place]      Future Appointments            In 4 weeks Gwyneth Sprout, [place], [facility]           Passed - Completed PHQ-2 or PHQ-9 in the last 360 days

## 2021-06-24 ENCOUNTER — Encounter: Payer: Self-pay | Admitting: Family Medicine

## 2021-06-24 DIAGNOSIS — F419 Anxiety disorder, unspecified: Secondary | ICD-10-CM

## 2021-06-26 MED ORDER — ALPRAZOLAM 0.5 MG PO TABS: 0.5000 mg | ORAL_TABLET | Freq: Two times a day (BID) | ORAL | 0 refills | Status: DC | PRN

## 2021-07-22 ENCOUNTER — Ambulatory Visit: Payer: Managed Care, Other (non HMO) | Admitting: Family Medicine

## 2021-07-22 ENCOUNTER — Encounter: Payer: Self-pay | Admitting: Family Medicine

## 2021-07-22 ENCOUNTER — Other Ambulatory Visit: Payer: Self-pay

## 2021-07-22 VITALS — BP 138/79 | HR 79 | Temp 97.8°F | Resp 16 | Wt 208.6 lb

## 2021-07-22 DIAGNOSIS — E1159 Type 2 diabetes mellitus with other circulatory complications: Secondary | ICD-10-CM

## 2021-07-22 DIAGNOSIS — G2581 Restless legs syndrome: Secondary | ICD-10-CM | POA: Insufficient documentation

## 2021-07-22 DIAGNOSIS — F418 Other specified anxiety disorders: Secondary | ICD-10-CM | POA: Insufficient documentation

## 2021-07-22 DIAGNOSIS — E6609 Other obesity due to excess calories: Secondary | ICD-10-CM | POA: Insufficient documentation

## 2021-07-22 DIAGNOSIS — E039 Hypothyroidism, unspecified: Secondary | ICD-10-CM | POA: Diagnosis not present

## 2021-07-22 DIAGNOSIS — E1069 Type 1 diabetes mellitus with other specified complication: Secondary | ICD-10-CM | POA: Insufficient documentation

## 2021-07-22 DIAGNOSIS — E1059 Type 1 diabetes mellitus with other circulatory complications: Secondary | ICD-10-CM | POA: Insufficient documentation

## 2021-07-22 DIAGNOSIS — I152 Hypertension secondary to endocrine disorders: Secondary | ICD-10-CM

## 2021-07-22 DIAGNOSIS — E785 Hyperlipidemia, unspecified: Secondary | ICD-10-CM

## 2021-07-22 DIAGNOSIS — Z6831 Body mass index (BMI) 31.0-31.9, adult: Secondary | ICD-10-CM

## 2021-07-22 NOTE — Assessment & Plan Note (Signed)
bmi 31.72 ?Pt has lost 80# with use of Regal since 2019 ?Has T1DM, HTN, HLD, depression, thyroid issues ?Discussed importance of healthy weight management ?Discussed diet and exercise ? ?

## 2021-07-22 NOTE — Assessment & Plan Note (Signed)
Chronic, stable- 138/79 ?Denies CP ?Denies SOB ?Denies DOE ?No LE Edema noted on exam ?Continue medication- losartan and topril ?Refills stable ?Seek emergent care if you develop CP, chest pain or chest pressure ? ?

## 2021-07-22 NOTE — Assessment & Plan Note (Signed)
Reinforced LDL goal <70 ?We recommend diet low in saturated fat and regular exercise - 30 min at least 5 times per week ? ?Currently on 40mg  of lipitor; denies complaints  ?

## 2021-07-22 NOTE — Progress Notes (Signed)
Established patient visit   Patient: Kristin [name]   DOB: 1960/12/26   61 y.o. Female  MRN: [id number] Visit Date: 07/22/2021  Today's healthcare provider: Gwyneth Sprout, FNP  .  Chief Complaint  Patient presents with   Hypertension   Hyperlipidemia   Subjective    HPI   Hypertension, follow-up  BP Readings from Last 3 Encounters:  07/22/21 138/79  02/02/21 117/72  11/19/19 (!) 143/77   Wt Readings from Last 3 Encounters:  07/22/21 208 lb 9.6 oz (94.6 kg)  02/02/21 222 lb 8 oz (100.9 kg)  11/19/19 263 lb 12.8 oz (119.7 kg)     She was last seen for hypertension 6 months ago.  BP at that visit was 117/72. Management since that visit includes none. She reports excellent compliance with treatment. She is not having side effects.  She is not exercising. She is adherent to low salt diet.   Outside blood pressures are not checked.  She does not smoke.  Use of agents associated with hypertension: none.   --------------------------------------------------------------------------------------------------- Lipid/Cholesterol, follow-up  Last Lipid Panel: Lab Results  Component Value Date   CHOL 137 02/05/2021   LDLCALC 63 02/05/2021   HDL 58 02/05/2021   TRIG 84 02/05/2021    She was last seen for this 6 months ago.  Management since that visit includes none.  She reports excellent compliance with treatment. She is not having side effects.  Symptoms: No appetite changes No foot ulcerations  No chest pain No chest pressure/discomfort  No dyspnea No orthopnea  No fatigue No lower extremity edema  No palpitations No paroxysmal nocturnal dyspnea  No nausea No numbness or tingling of extremity  No polydipsia No polyuria  No speech difficulty No syncope   She is following a Regular diet. Current exercise: none  Last metabolic panel Lab Results  Component Value Date   GLUCOSE 133 (H) 11/20/2019   NA 140 11/20/2019   K 4.5 11/20/2019   BUN 10  11/20/2019   CREATININE 0.67 11/20/2019   GFRNONAA 95 11/20/2019   CALCIUM 9.3 11/20/2019   AST 21 02/05/2021   ALT 27 02/05/2021   The 10-year ASCVD risk score (Arnett DK, et al., 2019) is: 9%  ---------------------------------------------------------------------------------------------------    Medications: Outpatient Medications Prior to Visit  Medication Sig   ALPRAZolam (XANAX) 0.5 MG tablet Take 1 tablet (0.5 mg total) by mouth 2 (two) times daily as needed for anxiety.   aspirin 81 MG tablet Take 81 mg by mouth daily.   atorvastatin (LIPITOR) 40 MG tablet Take 1 tablet (40 mg total) by mouth at bedtime.   Cholecalciferol (VITAMIN D3) 2000 units TABS Take 2 capsules by mouth daily.   Continuous Blood Gluc Sensor (DEXCOM G6 SENSOR) MISC Change every 10 days   Continuous Blood Gluc Transmit (DEXCOM G6 TRANSMITTER) MISC Change every 90 days   HUMALOG KWIKPEN 100 UNIT/ML KwikPen Inject into the skin.   insulin glargine (LANTUS) 100 UNIT/ML Solostar Pen Inject 44 Units into the skin daily at 10 pm.    levothyroxine (SYNTHROID) 150 MCG tablet Take 150 mcg by mouth daily.   losartan (COZAAR) 50 MG tablet TAKE 1 TABLET BY MOUTH  DAILY   metoprolol succinate (TOPROL-XL) 25 MG 24 hr tablet TAKE 1 TABLET BY MOUTH  DAILY   OZEMPIC, 1 MG/DOSE, 4 MG/3ML SOPN Inject into the skin.   sertraline (ZOLOFT) 100 MG tablet TAKE 1 TABLET BY MOUTH  DAILY   [DISCONTINUED] levothyroxine (SYNTHROID, LEVOTHROID) 200  MCG tablet Take 200 mcg by mouth daily before breakfast.   No facility-administered medications prior to visit.    Review of Systems     Objective    BP 138/79    Pulse 79    Temp 97.8 F (36.6 C) (Temporal)    Resp 16    Wt 208 lb 9.6 oz (94.6 kg)    SpO2 98%    BMI 31.72 kg/m    Physical Exam Vitals and nursing note reviewed.  Constitutional:      General: She is not in acute distress.    Appearance: Normal appearance. She is obese. She is not ill-appearing, toxic-appearing or  diaphoretic.  HENT:     Head: Normocephalic and atraumatic.  Cardiovascular:     Rate and Rhythm: Normal rate and regular rhythm.     Pulses: Normal pulses.     Heart sounds: Normal heart sounds. No murmur heard.   No friction rub. No gallop.  Pulmonary:     Effort: Pulmonary effort is normal. No respiratory distress.     Breath sounds: Normal breath sounds. No stridor. No wheezing, rhonchi or rales.  Chest:     Chest wall: No tenderness.  Abdominal:     General: Bowel sounds are normal.     Palpations: Abdomen is soft.  Musculoskeletal:        General: No swelling, tenderness, deformity or signs of injury. Normal range of motion.     Right lower leg: No edema.     Left lower leg: No edema.  Skin:    General: Skin is warm and dry.     Capillary Refill: Capillary refill takes less than 2 seconds.     Coloration: Skin is not jaundiced or pale.     Findings: No bruising, erythema, lesion or rash.  Neurological:     General: No focal deficit present.     Mental Status: She is alert and oriented to person, place, and time. Mental status is at baseline.     Cranial Nerves: No cranial nerve deficit.     Sensory: No sensory deficit.     Motor: No weakness.     Coordination: Coordination normal.  Psychiatric:        Mood and Affect: Mood normal.        Behavior: Behavior normal.        Thought Content: Thought content normal.        Judgment: Judgment normal.     No results found for any visits on 07/22/21.  Assessment & Plan     Problem List Items Addressed This Visit       Cardiovascular and Mediastinum   Hypertension associated with diabetes ([place])    Chronic, stable- 138/79 Denies CP Denies SOB Denies DOE No LE Edema noted on exam Continue medication- losartan and topril Refills stable Seek emergent care if you develop CP, chest pain or chest pressure       Relevant Medications   HUMALOG KWIKPEN 100 UNIT/ML KwikPen   OZEMPIC, 1 MG/DOSE, 4 MG/3ML SOPN      Endocrine   Acquired hypothyroidism    F/b endocrine Has had to change her medication dose recently Noted that she does not take the medication first thing in the morning or with water alone Encouraged water to take medication and 30-60 mins prior to other medications or foods/drinks      Relevant Medications   levothyroxine (SYNTHROID) 150 MCG tablet   Hyperlipidemia due to type 1 diabetes mellitus ([place])  Reinforced LDL goal <70 We recommend diet low in saturated fat and regular exercise - 30 min at least 5 times per week  Currently on 40mg  of lipitor; denies complaints       Relevant Medications   HUMALOG KWIKPEN 100 UNIT/ML KwikPen   OZEMPIC, 1 MG/DOSE, 4 MG/3ML SOPN   Type 1 diabetes mellitus with other specified complication ([place]) - Primary    Linked to depression Pt remains on zoloft A1c followed by endocrine; doing well weight loss wise- down 80# Continue to recommend balanced, lower carb meals. Smaller meal size, adding snacks. Choosing water as drink of choice and increasing purposeful exercise. Due for urine labs in 8/23      Relevant Medications   HUMALOG KWIKPEN 100 UNIT/ML KwikPen   OZEMPIC, 1 MG/DOSE, 4 MG/3ML SOPN     Other   Anxiety with depression    Chronic, stable Continue to recommend use of xanax for breakthrough only and recommend SSRI as daily medication Contracted to safety; denies SI or HI      Class 1 obesity due to excess calories with serious comorbidity and body mass index (BMI) of 31.0 to 31.9 in adult    bmi 31.72 Pt has lost 80# with use of [place] since 2019 Has T1DM, HTN, HLD, depression, thyroid issues Discussed importance of healthy weight management Discussed diet and exercise       Relevant Medications   HUMALOG KWIKPEN 100 UNIT/ML KwikPen   OZEMPIC, 1 MG/DOSE, 4 MG/3ML SOPN   Restless leg    Occasional; use of benzo- xanax can assist Encourage to not use daily to prevent dependence PDMP reviewed        Return in about 7  months (around 02/06/2022) for annual examination.      Vonna Kotyk, FNP, have reviewed all documentation for this visit. The documentation on 07/22/21 for the exam, diagnosis, procedures, and orders are all accurate and complete.   Fritzi Mandes Wolford,acting as a Education administrator for Gwyneth Sprout, FNP.,have documented all relevant documentation on the behalf of Gwyneth Sprout, FNP,as directed by  Gwyneth Sprout, FNP while in the presence of Gwyneth Sprout, FNP.   Gwyneth Sprout, [place] [phone] (phone) [phone] (fax)  [place]

## 2021-07-22 NOTE — Assessment & Plan Note (Signed)
Chronic, stable ?Continue to recommend use of xanax for breakthrough only and recommend SSRI as daily medication ?Contracted to safety; denies SI or HI ?

## 2021-07-22 NOTE — Assessment & Plan Note (Signed)
F/b endocrine ?Has had to change her medication dose recently ?Noted that she does not take the medication first thing in the morning or with water alone ?Encouraged water to take medication and 30-60 mins prior to other medications or foods/drinks ?

## 2021-07-22 NOTE — Assessment & Plan Note (Signed)
Linked to depression ?Pt remains on zoloft ?A1c followed by endocrine; doing well weight loss wise- down 80# ?Continue to recommend balanced, lower carb meals. Smaller meal size, adding snacks. Choosing water as drink of choice and increasing purposeful exercise. ?Due for urine labs in 8/23 ?

## 2021-07-22 NOTE — Assessment & Plan Note (Signed)
Occasional; use of benzo- xanax can assist ?Encourage to not use daily to prevent dependence ?PDMP reviewed ?

## 2021-08-10 ENCOUNTER — Ambulatory Visit: Payer: Managed Care, Other (non HMO) | Admitting: Family Medicine

## 2021-09-13 ENCOUNTER — Other Ambulatory Visit: Payer: Self-pay | Admitting: Family Medicine

## 2021-09-13 DIAGNOSIS — F3341 Major depressive disorder, recurrent, in partial remission: Secondary | ICD-10-CM

## 2021-09-19 ENCOUNTER — Other Ambulatory Visit: Payer: Self-pay | Admitting: Family Medicine

## 2021-09-19 DIAGNOSIS — I10 Essential (primary) hypertension: Secondary | ICD-10-CM

## 2021-09-21 ENCOUNTER — Other Ambulatory Visit: Payer: Self-pay | Admitting: Family Medicine

## 2021-09-21 ENCOUNTER — Telehealth: Payer: Self-pay | Admitting: Family Medicine

## 2021-09-21 DIAGNOSIS — F419 Anxiety disorder, unspecified: Secondary | ICD-10-CM

## 2021-09-21 MED ORDER — ALPRAZOLAM 0.5 MG PO TABS: 0.5000 mg | ORAL_TABLET | Freq: Two times a day (BID) | ORAL | 0 refills | Status: DC | PRN

## 2021-09-21 NOTE — Telephone Encounter (Signed)
[facility] faxed refill request for the following medications: ? ?ALPRAZolam (XANAX) 0.5 MG tablet  ? ? ?Please advise ? ? ?

## 2021-09-21 NOTE — Telephone Encounter (Signed)
Last office visit 07/22/21, last filled 06/26/21. KW ?

## 2021-09-21 NOTE — Telephone Encounter (Signed)
Requested medications are due for refill today.  yes ? ?Requested medications are on the active medications list.  yes ? ?Last refill. 01/01/2021 #90 3 refills ? ?Future visit scheduled.   yes ? ?Notes to clinic.  Pharmacy is requesting a 1 year supply. ? ? ? ?Requested Prescriptions  ?Pending Prescriptions Disp Refills  ? metoprolol succinate (TOPROL-XL) 25 MG 24 hr tablet [Pharmacy Med Name: Metoprolol Succinate ER 25 MG Oral Tablet Extended Release 24 Hour] 90 tablet 3  ?  Sig: TAKE 1 TABLET BY MOUTH  DAILY  ?  ? Cardiovascular:  Beta Blockers Passed - 09/19/2021 10:04 AM  ?  ?  Passed - Last BP in normal range  ?  BP Readings from Last 1 Encounters:  ?07/22/21 138/79  ?  ?  ?  ?  Passed - Last Heart Rate in normal range  ?  Pulse Readings from Last 1 Encounters:  ?07/22/21 79  ?  ?  ?  ?  Passed - Valid encounter within last 6 months  ?  Recent Outpatient Visits   ? ?      ? 2 months ago Type 1 diabetes mellitus with other specified complication ([place])  ? [facility] Gwyneth Sprout, FNP  ? 7 months ago Annual physical exam  ? [facility], Dionne Bucy, [place]  ? 1 year ago Annual physical exam  ? [place], [place]  ? 2 years ago Need for shingles vaccine  ? [place], [place]  ? 2 years ago Annual physical exam  ? [facility] M, [place]  ? ?  ?  ?Future Appointments   ? ?        ? In 4 months Gwyneth Sprout, [place], [facility]  ? ?  ? ? ?  ?  ?  ?  ?

## 2021-10-13 ENCOUNTER — Other Ambulatory Visit: Payer: Self-pay | Admitting: Family Medicine

## 2021-10-13 DIAGNOSIS — F419 Anxiety disorder, unspecified: Secondary | ICD-10-CM

## 2021-10-13 MED ORDER — ALPRAZOLAM 0.5 MG PO TABS: 0.5000 mg | ORAL_TABLET | Freq: Every day | ORAL | 0 refills | Status: DC | PRN

## 2021-10-13 NOTE — Telephone Encounter (Signed)
[facility] faxed refill request for the following medications:  ALPRAZolam (XANAX) 0.5 MG tablet   Please advise

## 2021-10-15 ENCOUNTER — Telehealth: Payer: Self-pay | Admitting: Family Medicine

## 2021-10-15 NOTE — Telephone Encounter (Signed)
[facility] pharmacy faxed refill request for the following medications:  ALPRAZolam (XANAX) 0.5 MG tablet    Please advise

## 2022-01-10 ENCOUNTER — Other Ambulatory Visit: Payer: Self-pay | Admitting: Family Medicine

## 2022-01-10 DIAGNOSIS — E785 Hyperlipidemia, unspecified: Secondary | ICD-10-CM

## 2022-01-10 DIAGNOSIS — E1069 Type 1 diabetes mellitus with other specified complication: Secondary | ICD-10-CM

## 2022-01-25 IMAGING — MG DIGITAL SCREENING BILAT W/ TOMO W/ CAD
6 of 10 series · 6 of 30 positions shown · non-contrast
Comparison: Previous exam(s).

CLINICAL DATA: Screening.

EXAM:
DIGITAL SCREENING BILATERAL MAMMOGRAM WITH TOMO AND CAD

[L CC synth-2D]
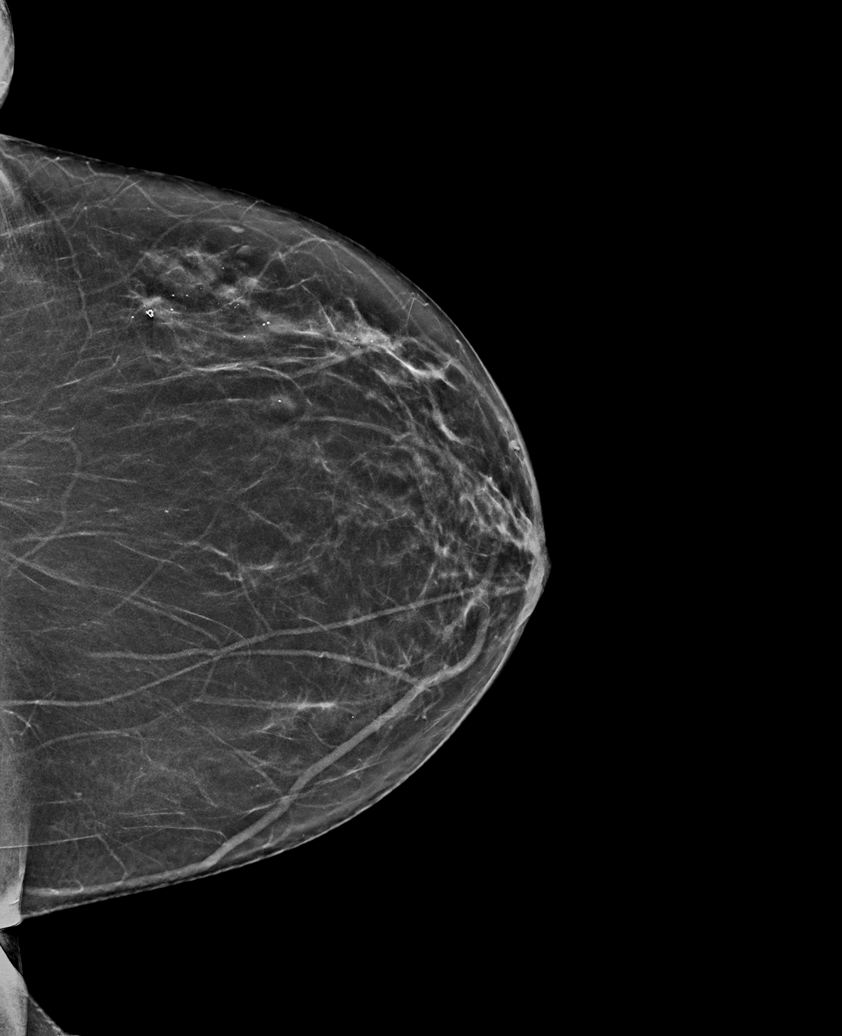

[L MLO synth-2D (1 of 2)]
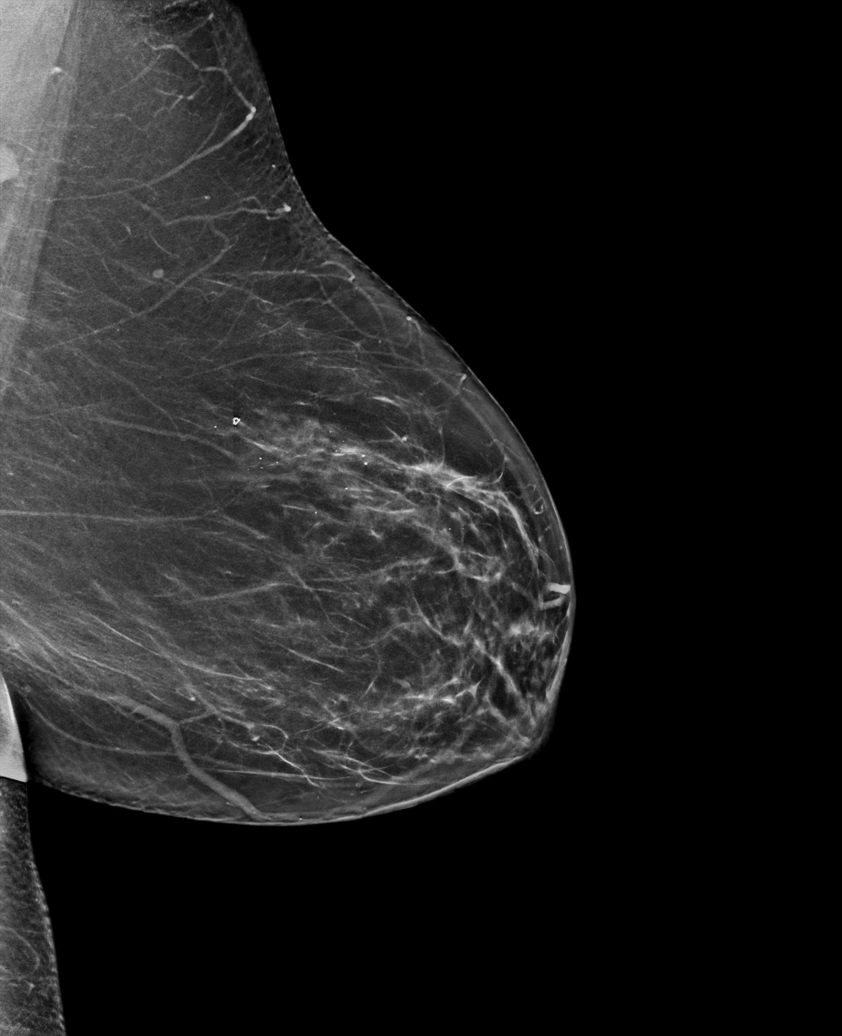

[L MLO synth-2D (2 of 2)]
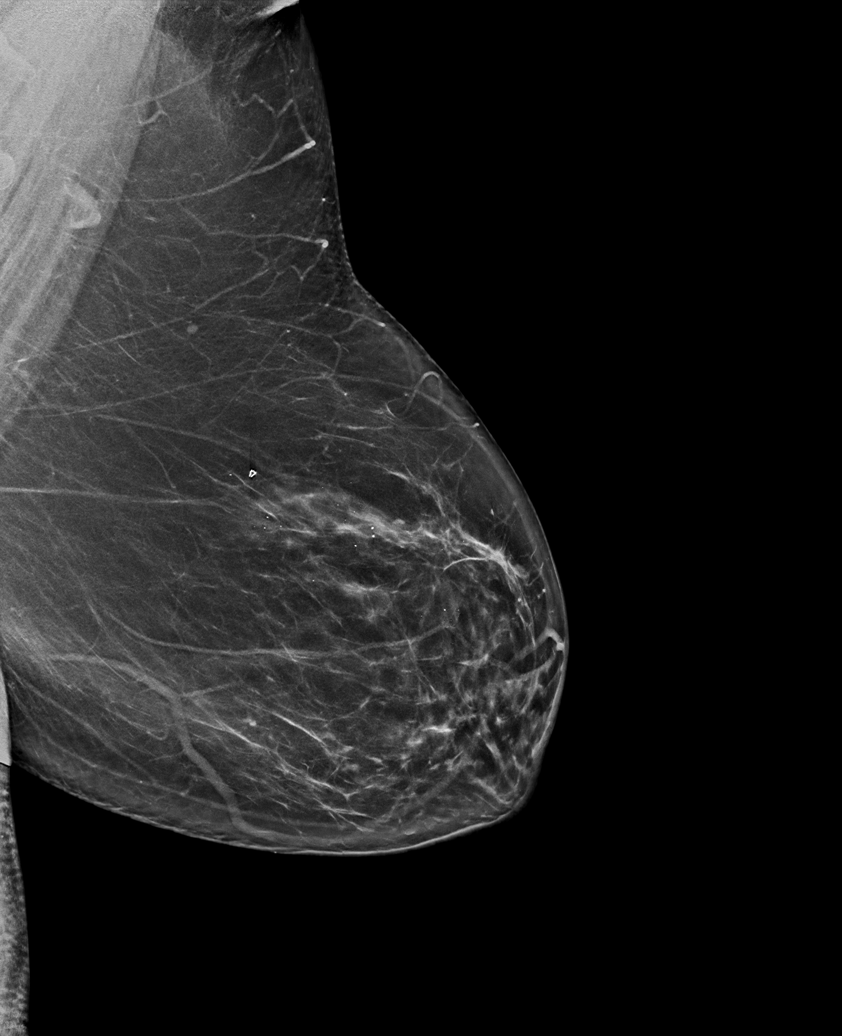

[R CC synth-2D]
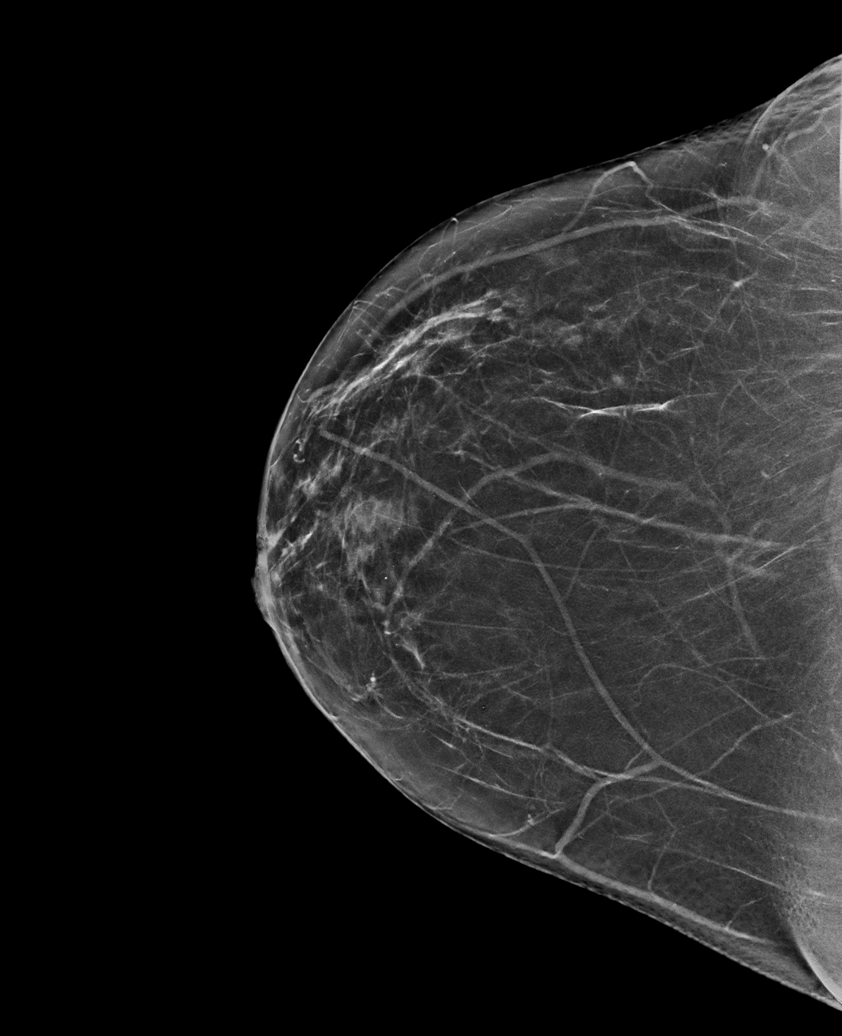

[R MLO synth-2D]
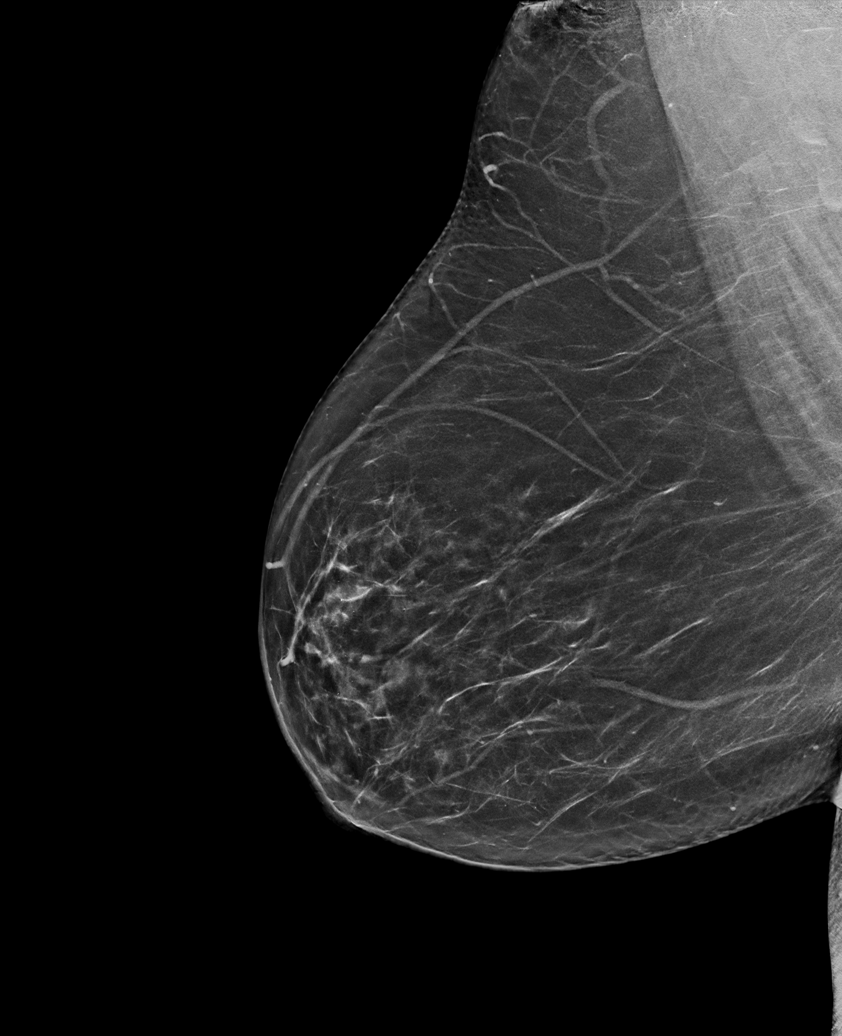

[L CC tomo · tomo slice 29/57.0]
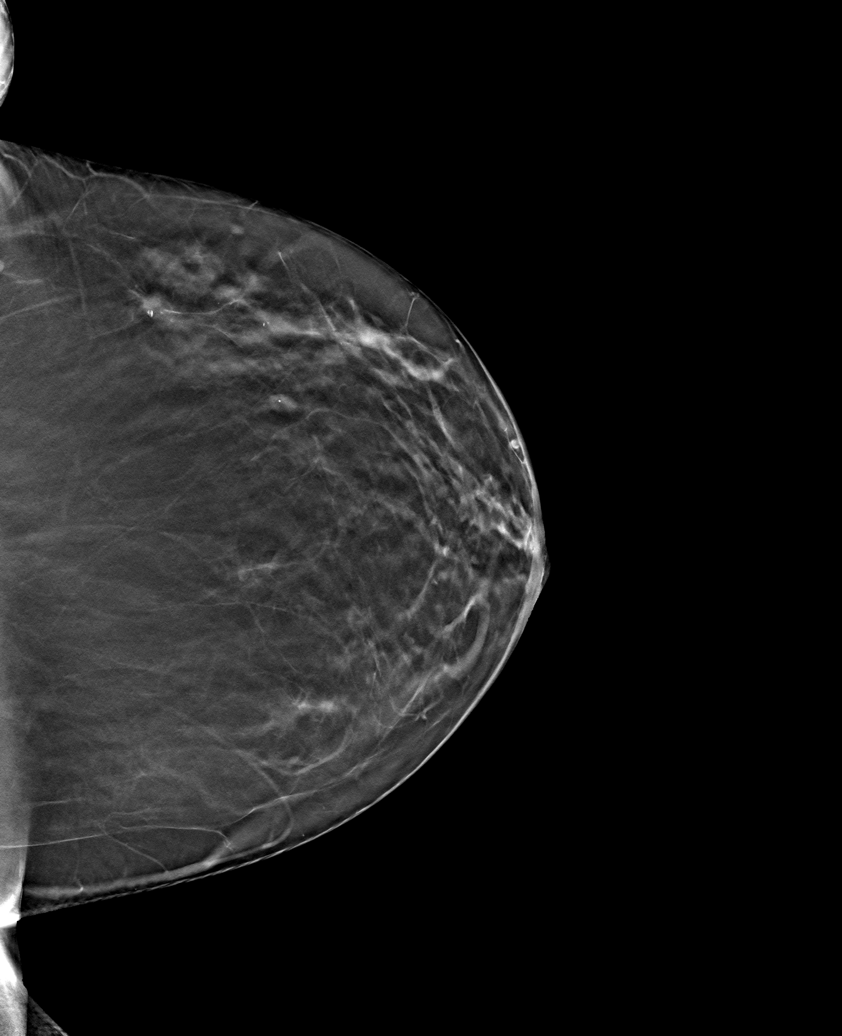

[6 of 30 positions shown; findings below may reference images not displayed]

ACR Breast Density Category b: There are scattered areas of
fibroglandular density.
FINDINGS: There are no findings suspicious for malignancy. Images were
processed with CAD.
IMPRESSION: No mammographic evidence of malignancy. A result letter of this
screening mammogram will be mailed directly to the patient.

RECOMMENDATION:
Screening mammogram in one year. (Code:[id number])

BI-RADS CATEGORY  1: Negative.

## 2022-02-10 NOTE — Progress Notes (Signed)
Complete physical exam   Patient: Kristin [name]   DOB: 1960-11-25   61 y.o. Female  MRN: [id number] Visit Date: 02/12/2022  Today's healthcare provider: Gwyneth Sprout, FNP  Re Introduced to nurse practitioner role and practice setting.  All questions answered.  Discussed provider/patient relationship and expectations.  I,Tiffany J Bragg,acting as a scribe for Gwyneth Sprout, FNP.,have documented all relevant documentation on the behalf of Gwyneth Sprout, FNP,as directed by  Gwyneth Sprout, FNP while in the presence of Gwyneth Sprout, FNP.   Chief Complaint  Patient presents with   Annual Exam   Subjective    Kristin [name] is a 61 y.o. female who presents today for a complete physical exam.  She reports consuming a general diet. The patient does not participate in regular exercise at present. She generally feels well. She reports sleeping well. She does not have additional problems to discuss today.  HPI    Past Medical History:  Diagnosis Date   Anxiety    Depression    Diabetes mellitus without complication ([place])    type 1   Elevated cholesterol    Hypertension    Hypothyroidism    Past Surgical History:  Procedure Laterality Date   BREAST BIOPSY Left    bx/clip-neg   CARDIAC VALVE SURGERY  1963   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   CARPAL TUNNEL RELEASE Left 11/06/2015   Procedure: CARPAL TUNNEL RELEASE;  Surgeon: Hessie Knows, MD;  Location: [facility] ORS;  Service: Orthopedics;  Laterality: Left;   COLONOSCOPY WITH PROPOFOL N/A 07/14/2017   Procedure: COLONOSCOPY WITH PROPOFOL;  Surgeon: Lin Landsman, MD;  Location: [facility];  Service: Gastroenterology;  Laterality: N/A;   ctr Right    HAND SURGERY Right 2010   carpal tunnel   THYROID LOBECTOMY Left 03/2000   Social History   Socioeconomic History   Marital status: Widowed    Spouse name: Not on file   Number of children: 0   Years of education: College   Highest education level: Not  on file  Occupational History   Not on file  Tobacco Use   Smoking status: Former    Packs/day: 1.00    Years: 33.00    Total pack years: 33.00    Types: Cigarettes    Quit date: 05/24/2008    Years since quitting: 13.7   Smokeless tobacco: Never  Substance and Sexual Activity   Alcohol use: Yes    Comment: Occasional alcohol use   Drug use: No   Sexual activity: Not on file  Other Topics Concern   Not on file  Social History Narrative   Not on file   Social Determinants of Health   Financial Resource Strain: Not on file  Food Insecurity: Not on file  Transportation Needs: Not on file  Physical Activity: Not on file  Stress: Not on file  Social Connections: Not on file  Intimate Partner Violence: Not on file   Family Status  Relation Name Status   Mother  Alive   Sister  Alive   Brother  Deceased       MVA   MGM  Deceased   MGF  Deceased   Brother  Alive   Father Unknown (Not Specified)   Neg Hx  (Not Specified)   Family History  Problem Relation Age of Onset   Alcohol abuse Mother    Anxiety disorder Mother    Hypothyroidism Mother    Alcohol  abuse Sister    Anxiety disorder Sister    Bipolar disorder Sister    Alcohol abuse Brother    Depression Maternal Grandmother    Stroke Maternal Grandmother    Alcohol abuse Maternal Grandfather    Heart disease Maternal Grandfather    Healthy Brother    Breast cancer Neg Hx    Allergies  Allergen Reactions   Asacol  [Mesalamine]     Other reaction(s): Abdominal pain   Lisinopril Cough    Patient Care Team: Gwyneth Sprout, FNP as PCP - General (Family Medicine)   Medications: Outpatient Medications Prior to Visit  Medication Sig   ALPRAZolam (XANAX) 0.5 MG tablet Take 1 tablet (0.5 mg total) by mouth daily as needed for anxiety.   aspirin 81 MG tablet Take 81 mg by mouth daily.   atorvastatin (LIPITOR) 40 MG tablet TAKE 1 TABLET BY MOUTH AT  BEDTIME   Cholecalciferol (VITAMIN D3) 2000 units TABS Take 2  capsules by mouth daily.   Continuous Blood Gluc Sensor (DEXCOM G6 SENSOR) MISC Change every 10 days   Continuous Blood Gluc Transmit (DEXCOM G6 TRANSMITTER) MISC Change every 90 days   insulin glargine (LANTUS) 100 UNIT/ML Solostar Pen Inject 44 Units into the skin daily at 10 pm.    levothyroxine (SYNTHROID) 150 MCG tablet Take 150 mcg by mouth daily.   losartan (COZAAR) 50 MG tablet TAKE 1 TABLET BY MOUTH  DAILY   metoprolol succinate (TOPROL-XL) 25 MG 24 hr tablet TAKE 1 TABLET BY MOUTH  DAILY   OZEMPIC, 1 MG/DOSE, 4 MG/3ML SOPN Inject into the skin.   sertraline (ZOLOFT) 100 MG tablet TAKE 1 TABLET BY MOUTH DAILY   HUMALOG KWIKPEN 100 UNIT/ML KwikPen Inject into the skin.   No facility-administered medications prior to visit.    Review of Systems   Objective     BP 110/74 (BP Location: Left Arm, Patient Position: Sitting, Cuff Size: Normal)   Pulse 68   Resp 16   Ht '5\' 8"'$  (1.727 m)   Wt 199 lb (90.3 kg)   SpO2 98%   BMI 30.26 kg/m    Physical Exam Vitals and nursing note reviewed.  Constitutional:      General: She is awake. She is not in acute distress.    Appearance: Normal appearance. She is well-developed and well-groomed. She is obese. She is not ill-appearing, toxic-appearing or diaphoretic.  HENT:     Head: Normocephalic and atraumatic.     Jaw: There is normal jaw occlusion. No trismus, tenderness, swelling or pain on movement.     Right Ear: Hearing, tympanic membrane, ear canal and external ear normal. There is no impacted cerumen.     Left Ear: Hearing, tympanic membrane, ear canal and external ear normal. There is no impacted cerumen.     Nose: Nose normal. No congestion or rhinorrhea.     Right Turbinates: Not enlarged, swollen or pale.     Left Turbinates: Not enlarged, swollen or pale.     Right Sinus: No maxillary sinus tenderness or frontal sinus tenderness.     Left Sinus: No maxillary sinus tenderness or frontal sinus tenderness.     Mouth/Throat:      Lips: Pink.     Mouth: Mucous membranes are moist. No injury.     Tongue: No lesions.     Pharynx: Oropharynx is clear. Uvula midline. No pharyngeal swelling, oropharyngeal exudate, posterior oropharyngeal erythema or uvula swelling.     Tonsils: No tonsillar exudate or  tonsillar abscesses.  Eyes:     General: Lids are normal. Lids are everted, no foreign bodies appreciated. Vision grossly intact. Gaze aligned appropriately. No allergic shiner or visual field deficit.       Right eye: No discharge.        Left eye: No discharge.     Extraocular Movements: Extraocular movements intact.     Conjunctiva/sclera: Conjunctivae normal.     Right eye: Right conjunctiva is not injected. No exudate.    Left eye: Left conjunctiva is not injected. No exudate.    Pupils: Pupils are equal, round, and reactive to light.  Neck:     Thyroid: No thyroid mass, thyromegaly or thyroid tenderness.     Vascular: No carotid bruit.     Trachea: Trachea normal.  Cardiovascular:     Rate and Rhythm: Normal rate and regular rhythm.     Pulses: Normal pulses.          Carotid pulses are 2+ on the right side and 2+ on the left side.      Radial pulses are 2+ on the right side and 2+ on the left side.       Dorsalis pedis pulses are 2+ on the right side and 2+ on the left side.       Posterior tibial pulses are 2+ on the right side and 2+ on the left side.     Heart sounds: Normal heart sounds, S1 normal and S2 normal. No murmur heard.    No friction rub. No gallop.  Pulmonary:     Effort: Pulmonary effort is normal. No respiratory distress.     Breath sounds: Normal breath sounds and air entry. No stridor. No wheezing, rhonchi or rales.  Chest:     Chest wall: No tenderness.     Comments: Breasts: risk and benefit of breast self-exam was discussed, not examined  Abdominal:     General: Abdomen is flat. Bowel sounds are normal. There is no distension.     Palpations: Abdomen is soft. There is no mass.      Tenderness: There is no abdominal tenderness. There is no right CVA tenderness, left CVA tenderness, guarding or rebound.     Hernia: No hernia is present.  Genitourinary:    Comments: Exam deferred; denies complaints Musculoskeletal:        General: No swelling, tenderness, deformity or signs of injury. Normal range of motion.     Cervical back: Full passive range of motion without pain, normal range of motion and neck supple. No edema, rigidity or tenderness. No muscular tenderness.     Right lower leg: No edema.     Left lower leg: No edema.  Lymphadenopathy:     Cervical: No cervical adenopathy.     Right cervical: No superficial, deep or posterior cervical adenopathy.    Left cervical: No superficial, deep or posterior cervical adenopathy.  Skin:    General: Skin is warm and dry.     Capillary Refill: Capillary refill takes less than 2 seconds.     Coloration: Skin is not jaundiced or pale.     Findings: No bruising, erythema, lesion or rash.  Neurological:     General: No focal deficit present.     Mental Status: She is alert and oriented to person, place, and time. Mental status is at baseline.     GCS: GCS eye subscore is 4. GCS verbal subscore is 5. GCS motor subscore is 6.  Sensory: Sensation is intact. No sensory deficit.     Motor: Motor function is intact. No weakness.     Coordination: Coordination is intact. Coordination normal.     Gait: Gait is intact. Gait normal.  Psychiatric:        Attention and Perception: Attention and perception normal.        Mood and Affect: Mood and affect normal.        Speech: Speech normal.        Behavior: Behavior normal. Behavior is cooperative.        Thought Content: Thought content normal.        Cognition and Memory: Cognition and memory normal.        Judgment: Judgment normal.      Last depression screening scores    02/12/2022    9:25 AM 02/02/2021    3:47 PM 11/19/2019    8:04 AM  PHQ 2/9 Scores  PHQ - 2 Score 2 2 0   PHQ- 9 Score 2 3 0   Last fall risk screening    02/12/2022    9:25 AM  H. Rivera Colon in the past year? 0  Number falls in past yr: 0  Injury with Fall? 0  Risk for fall due to : No Fall Risks  Follow up Falls evaluation completed   Last Audit-C alcohol use screening    02/12/2022    9:25 AM  Alcohol Use Disorder Test (AUDIT)  1. How often do you have a drink containing alcohol? 3  2. How many drinks containing alcohol do you have on a typical day when you are drinking? 0  3. How often do you have six or more drinks on one occasion? 0  AUDIT-C Score 3   A score of 3 or more in women, and 4 or more in men indicates increased risk for alcohol abuse, EXCEPT if all of the points are from question 1   No results found for any visits on 02/12/22.  Assessment & Plan    Routine Health Maintenance and Physical Exam  Exercise Activities and Dietary recommendations  Goals   None     Immunization History  Administered Date(s) Administered   H1N1 08/01/2008   Influenza Inj Mdck Quad Pf 03/24/2019   Influenza Split 03/31/2012, 04/14/2015   Influenza, High Dose Seasonal PF 05/15/2018   Influenza,inj,Quad PF,6+ Mos 04/17/2013, 04/26/2014, 05/15/2018, 01/31/2020, 02/02/2021, 02/12/2022   Influenza-Unspecified 04/17/2013, 04/26/2014, 02/19/2016, 02/11/2017   Moderna Sars-Covid-2 Vaccination 08/22/2019, 09/19/2019, 04/01/2020, 09/05/2020   Pneumococcal Polysaccharide-23 04/17/2013   Td 02/02/2021   Tdap 01/14/2011   Zoster Recombinat (Shingrix) 11/13/2018, 01/22/2019   Zoster, Live 05/15/2013    Health Maintenance  Topic Date Due   HEMOGLOBIN A1C  03/04/2020   COVID-19 Vaccine (5 - Moderna series) 10/31/2020   Diabetic kidney evaluation - GFR measurement  11/19/2020   FOOT EXAM  02/02/2022   OPHTHALMOLOGY EXAM  02/05/2022   COLONOSCOPY (Pts 45-50yr Insurance coverage will need to be confirmed)  07/14/2022   Diabetic kidney evaluation - Urine ACR  02/02/2023   MAMMOGRAM   02/18/2023   PAP SMEAR-Modifier  11/13/2023   TETANUS/TDAP  02/03/2031   INFLUENZA VACCINE  Completed   Hepatitis C Screening  Completed   HIV Screening  Completed   Zoster Vaccines- Shingrix  Completed   HPV VACCINES  Aged Out    Discussed health benefits of physical activity, and encouraged her to engage in regular exercise appropriate for her age and condition.  Problem List Items Addressed This Visit       Other   Annual physical exam - Primary    All other labs followed by endocrine  UTD on vision UTD on dental  Things to do to keep yourself healthy  - Exercise at least 30-45 minutes a day, 3-4 days a week.  - Eat a low-fat diet with lots of fruits and vegetables, up to 7-9 servings per day.  - Seatbelts can save your life. Wear them always.  - Smoke detectors on every level of your home, check batteries every year.  - Eye Doctor - have an eye exam every 1-2 years  - Safe sex - if you may be exposed to STDs, use a condom.  - Alcohol -  If you drink, do it moderately, less than 2 drinks per day.  - [place]. Choose someone to speak for you if you are not able.  - Depression is common in our stressful world.If you're feeling down or losing interest in things you normally enjoy, please come in for a visit.  - Violence - If anyone is threatening or hurting you, please call immediately.        Relevant Orders   CBC with Differential/Platelet   Colon cancer screening    Due for 5 year follow up in 06/2022; denies any current concerns. Will place referral.      Relevant Orders   Ambulatory referral to Gastroenterology   Encounter for screening mammogram for malignant neoplasm of breast    Due for screening for mammogram, denies breast concerns, provided with phone number to call and schedule appointment for mammogram. Encouraged to repeat breast cancer screening every 1-2 years.  Please call and schedule your mammogram:  [facility]  [facility]  [place], [place],  [place]  [id number] Get Driving Directions Main: [phone]  Sunday:Closed Monday:7:20 AM - 5:00 PM Tuesday:7:20 AM - 5:00 PM Wednesday:7:20 AM - 5:00 PM Thursday:7:20 AM - 5:00 PM Friday:7:20 AM - 4:30 PM Saturday:Closed       Relevant Orders   MM 3D SCREEN BREAST BILATERAL   Need for influenza vaccination    Consented; VIS made available; no immediate side effects following administration; plan to repeat annually        Relevant Orders   Flu Vaccine QUAD 6+ mos PF IM (Fluarix Quad PF) (Completed)     Return in about 1 year (around 02/14/2023) for annual examination.     Vonna Kotyk, FNP, have reviewed all documentation for this visit. The documentation on 02/12/22 for the exam, diagnosis, procedures, and orders are all accurate and complete.    Gwyneth Sprout, [place] [phone] (phone) [phone] (fax)  [place]

## 2022-02-12 ENCOUNTER — Ambulatory Visit (INDEPENDENT_AMBULATORY_CARE_PROVIDER_SITE_OTHER): Payer: Managed Care, Other (non HMO) | Admitting: Family Medicine

## 2022-02-12 ENCOUNTER — Encounter: Payer: Self-pay | Admitting: Family Medicine

## 2022-02-12 VITALS — BP 110/74 | HR 68 | Resp 16 | Ht 68.0 in | Wt 199.0 lb

## 2022-02-12 DIAGNOSIS — Z1211 Encounter for screening for malignant neoplasm of colon: Secondary | ICD-10-CM | POA: Diagnosis not present

## 2022-02-12 DIAGNOSIS — Z0000 Encounter for general adult medical examination without abnormal findings: Secondary | ICD-10-CM

## 2022-02-12 DIAGNOSIS — Z23 Encounter for immunization: Secondary | ICD-10-CM | POA: Diagnosis not present

## 2022-02-12 DIAGNOSIS — Z1231 Encounter for screening mammogram for malignant neoplasm of breast: Secondary | ICD-10-CM | POA: Insufficient documentation

## 2022-02-12 NOTE — Assessment & Plan Note (Signed)
Due for screening for mammogram, denies breast concerns, provided with phone number to call and schedule appointment for mammogram. Encouraged to repeat breast cancer screening every 1-2 years.  Please call and schedule your mammogram:  [facility]  [address], [address] [facility] [place],    [id number] Get Driving Directions Main: [phone]  Sunday:Closed Monday:7:20 AM - 5:00 PM Tuesday:7:20 AM - 5:00 PM Wednesday:7:20 AM - 5:00 PM Thursday:7:20 AM - 5:00 PM Friday:7:20 AM - 4:30 PM Saturday:Closed  

## 2022-02-12 NOTE — Assessment & Plan Note (Signed)
Due for 5 year follow up in 06/2022; denies any current concerns. Will place referral.

## 2022-02-12 NOTE — Assessment & Plan Note (Signed)
Consented; VIS made available; no immediate side effects following administration; plan to repeat annually   

## 2022-02-12 NOTE — Assessment & Plan Note (Signed)
All other labs followed by endocrine  UTD on vision UTD on dental  Things to do to keep yourself healthy  - Exercise at least 30-45 minutes a day, 3-4 days a week.  - Eat a low-fat diet with lots of fruits and vegetables, up to 7-9 servings per day.  - Seatbelts can save your life. Wear them always.  - Smoke detectors on every level of your home, check batteries every year.  - Eye Doctor - have an eye exam every 1-2 years  - Safe sex - if you may be exposed to STDs, use a condom.  - Alcohol -  If you drink, do it moderately, less than 2 drinks per day.  - [place]. Choose someone to speak for you if you are not able.  - Depression is common in our stressful world.If you're feeling down or losing interest in things you normally enjoy, please come in for a visit.  - Violence - If anyone is threatening or hurting you, please call immediately.

## 2022-02-13 LAB — CBC WITH DIFFERENTIAL/PLATELET
Basophils Absolute: 0.1 10*3/uL (ref 0.0–0.2)
Basos: 1 %
EOS (ABSOLUTE): 0.1 10*3/uL (ref 0.0–0.4)
Eos: 2 %
Hematocrit: 40.9 % (ref 34.0–46.6)
Hemoglobin: 13.8 g/dL (ref 11.1–15.9)
Immature Grans (Abs): 0 10*3/uL (ref 0.0–0.1)
Immature Granulocytes: 0 %
Lymphocytes Absolute: 2.1 10*3/uL (ref 0.7–3.1)
Lymphs: 37 %
MCH: 32.4 pg (ref 26.6–33.0)
MCHC: 33.7 g/dL (ref 31.5–35.7)
MCV: 96 fL (ref 79–97)
Monocytes Absolute: 0.5 10*3/uL (ref 0.1–0.9)
Monocytes: 9 %
Neutrophils Absolute: 2.8 10*3/uL (ref 1.4–7.0)
Neutrophils: 51 %
Platelets: 187 10*3/uL (ref 150–450)
RBC: 4.26 x10E6/uL (ref 3.77–5.28)
RDW: 12.8 % (ref 11.7–15.4)
WBC: 5.5 10*3/uL (ref 3.4–10.8)

## 2022-02-14 NOTE — Progress Notes (Signed)
Cell count is normal and stable.  Gwyneth Sprout, [place] [address] [place], [place] [address] [phone] (phone) [phone] (fax) [place]

## 2022-02-20 ENCOUNTER — Other Ambulatory Visit: Payer: Self-pay | Admitting: Family Medicine

## 2022-02-20 DIAGNOSIS — F3341 Major depressive disorder, recurrent, in partial remission: Secondary | ICD-10-CM

## 2022-03-08 ENCOUNTER — Encounter: Payer: Self-pay | Admitting: Family Medicine

## 2022-03-21 ENCOUNTER — Other Ambulatory Visit: Payer: Self-pay | Admitting: Family Medicine

## 2022-03-21 DIAGNOSIS — E1069 Type 1 diabetes mellitus with other specified complication: Secondary | ICD-10-CM

## 2022-03-26 ENCOUNTER — Other Ambulatory Visit: Payer: Self-pay | Admitting: Family Medicine

## 2022-03-26 DIAGNOSIS — F419 Anxiety disorder, unspecified: Secondary | ICD-10-CM

## 2022-03-26 NOTE — Telephone Encounter (Signed)
Requested medication (s) are due for refill today: yes   Requested medication (s) are on the active medication list: yes   Last refill:  10/13/21 #30 0 refills   Future visit scheduled: no   Notes to clinic:   not delegated per protocol. Do you want to refill Rx?     Requested Prescriptions  Pending Prescriptions Disp Refills   ALPRAZolam (XANAX) 0.5 MG tablet [Pharmacy Med Name: ALPRAZOLAM 0.5 MG TABLET] 30 tablet 0    Sig: TAKE 1 TABLET BY MOUTH EVERY DAY AS NEEDED FOR ANXIETY     Not Delegated - Psychiatry: Anxiolytics/Hypnotics 2 Failed - 03/26/2022 11:51 AM      Failed - This refill cannot be delegated      Failed - Urine Drug Screen completed in last 360 days      Passed - Patient is not pregnant      Passed - Valid encounter within last 6 months    Recent Outpatient Visits           1 month ago Annual physical exam   [facility] Tally Joe T, FNP   8 months ago Type 1 diabetes mellitus with other specified complication [facility])   [facility] Gwyneth Sprout, FNP   1 year ago Annual physical exam   [facility] Bacigalupo, Dionne Bucy, MD   2 years ago Annual physical exam   [facility] Fenton Malling M, [place]   3 years ago Need for shingles vaccine   [facility] [place], [place], [place]

## 2022-04-30 ENCOUNTER — Other Ambulatory Visit: Payer: Self-pay | Admitting: Family Medicine

## 2022-04-30 DIAGNOSIS — F3341 Major depressive disorder, recurrent, in partial remission: Secondary | ICD-10-CM

## 2022-04-30 NOTE — Telephone Encounter (Signed)
Unable to refill per protocol, Rx request is too soon last refill 06/01/21 for 90 and 3 refills.  Requested Prescriptions  Pending Prescriptions Disp Refills   losartan (COZAAR) 50 MG tablet [Pharmacy Med Name: Losartan Potassium 50 MG Oral Tablet] 90 tablet 3    Sig: TAKE 1 TABLET BY MOUTH DAILY     Cardiovascular:  Angiotensin Receptor Blockers Failed - 04/30/2022  6:35 AM      Failed - Cr in normal range and within 180 days    Creatinine, Ser  Date Value Ref Range Status  11/20/2019 0.67 0.57 - 1.00 mg/dL Final         Failed - K in normal range and within 180 days    Potassium  Date Value Ref Range Status  11/20/2019 4.5 3.5 - 5.2 mmol/L Final         Passed - Patient is not pregnant      Passed - Last BP in normal range    BP Readings from Last 1 Encounters:  02/12/22 110/74         Passed - Valid encounter within last 6 months    Recent Outpatient Visits           2 months ago Annual physical exam   [facility] Tally Joe T, FNP   9 months ago Type 1 diabetes mellitus with other specified complication [facility])   [facility] Gwyneth Sprout, FNP   1 year ago Annual physical exam   [facility] Bacigal[place], Dionne Bucy, MD   2 years ago Annual physical exam   [facility] Fenton Malling M, [place]   3 years ago Need for shingles vaccine   [facility] [place], [place], [place]

## 2022-04-30 NOTE — Telephone Encounter (Signed)
Unable to refill per protocol, request is too soon. Last refill 02/22/22 for 90 days. Should have enough through December. Will refuse.  Requested Prescriptions  Pending Prescriptions Disp Refills   sertraline (ZOLOFT) 100 MG tablet [Pharmacy Med Name: Sertraline HCl 100 MG Oral Tablet] 90 tablet 3    Sig: TAKE 1 TABLET BY MOUTH DAILY     Psychiatry:  Antidepressants - SSRI - sertraline Failed - 04/30/2022  6:35 AM      Failed - AST in normal range and within 360 days    AST  Date Value Ref Range Status  02/05/2021 21 0 - 40 IU/L Final         Failed - ALT in normal range and within 360 days    ALT  Date Value Ref Range Status  02/05/2021 27 0 - 32 IU/L Final         Passed - Completed PHQ-2 or PHQ-9 in the last 360 days      Passed - Valid encounter within last 6 months    Recent Outpatient Visits           2 months ago Annual physical exam   [facility] Tally Joe T, FNP   9 months ago Type 1 diabetes mellitus with other specified complication [facility])   [facility] Gwyneth Sprout, FNP   1 year ago Annual physical exam   [facility] Bacigalupo, Dionne Bucy, MD   2 years ago Annual physical exam   [facility] Fenton Malling M, [place]   3 years ago Need for shingles vaccine   [facility] [place], [place], [place]

## 2022-05-21 ENCOUNTER — Telehealth: Payer: Self-pay

## 2022-05-21 ENCOUNTER — Other Ambulatory Visit: Payer: Self-pay

## 2022-05-21 DIAGNOSIS — Z86010 Personal history of colonic polyps: Secondary | ICD-10-CM

## 2022-05-21 MED ORDER — NA SULFATE-K SULFATE-MG SULF 17.5-3.13-1.6 GM/177ML PO SOLN: 1.0000 | Freq: Once | ORAL | 0 refills | Status: AC

## 2022-05-21 NOTE — Telephone Encounter (Signed)
Gastroenterology Pre-Procedure Review  Request Date: 07/23/22 Requesting Physician: Dr. Marius Ditch  PATIENT REVIEW QUESTIONS: The patient responded to the following health history questions as indicated:    1. Are you having any GI issues? yes (loose stools) 2. Do you have a personal history of Polyps? yes (last colonoscopy performed by Dr. Marius Ditch on 07/14/17) 3. Do you have a family history of Colon Cancer or Polyps? no 4. Diabetes Mellitus? yes (takes Ozempic has been advised to stop 1 week prior to colonoscopy) 5. Joint replacements in the past 12 months?no 6. Major health problems in the past 3 months?no 7. Any artificial heart valves, MVP, or defibrillator?no    MEDICATIONS & ALLERGIES:    Patient reports the following regarding taking any anticoagulation/antiplatelet therapy:   Plavix, Coumadin, Eliquis, Xarelto, Lovenox, Pradaxa, Brilinta, or Effient? no Aspirin? yes ('81mg'$  daily)  Patient confirms/reports the following medications:  Current Outpatient Medications  Medication Sig Dispense Refill   ALPRAZolam (XANAX) 0.5 MG tablet TAKE 1 TABLET BY MOUTH EVERY DAY AS NEEDED FOR ANXIETY 30 tablet 0   aspirin 81 MG tablet Take 81 mg by mouth daily.     atorvastatin (LIPITOR) 40 MG tablet TAKE 1 TABLET BY MOUTH AT  BEDTIME 90 tablet 3   Cholecalciferol (VITAMIN D3) 2000 units TABS Take 2 capsules by mouth daily.     Continuous Blood Gluc Sensor (DEXCOM G6 SENSOR) MISC Change every 10 days     Continuous Blood Gluc Transmit (DEXCOM G6 TRANSMITTER) MISC Change every 90 days     HUMALOG KWIKPEN 100 UNIT/ML KwikPen Inject into the skin.     insulin glargine (LANTUS) 100 UNIT/ML Solostar Pen Inject 44 Units into the skin daily at 10 pm.      levothyroxine (SYNTHROID) 150 MCG tablet Take 150 mcg by mouth daily.     losartan (COZAAR) 50 MG tablet TAKE 1 TABLET BY MOUTH  DAILY 90 tablet 3   metoprolol succinate (TOPROL-XL) 25 MG 24 hr tablet TAKE 1 TABLET BY MOUTH  DAILY 90 tablet 3   OZEMPIC, 1  MG/DOSE, 4 MG/3ML SOPN Inject into the skin.     sertraline (ZOLOFT) 100 MG tablet TAKE 1 TABLET BY MOUTH DAILY 90 tablet 0   No current facility-administered medications for this visit.    Patient confirms/reports the following allergies:  Allergies  Allergen Reactions   Asacol  [Mesalamine]     Other reaction(s): Abdominal pain   Lisinopril Cough    No orders of the defined types were placed in this encounter.   AUTHORIZATION INFORMATION Primary Insurance: 1D#: Group #:  Secondary Insurance: 1D#: Group #:  SCHEDULE INFORMATION: Date: 07/23/22 Time: Location: [facility]

## 2022-05-27 ENCOUNTER — Other Ambulatory Visit: Payer: Self-pay | Admitting: Family Medicine

## 2022-05-27 DIAGNOSIS — F419 Anxiety disorder, unspecified: Secondary | ICD-10-CM

## 2022-05-27 NOTE — Telephone Encounter (Signed)
Requested medication (s) are due for refill today - yes  Requested medication (s) are on the active medication list -yes  Future visit scheduled -no  Last refill: 03/26/22 #30  Notes to clinic: non delegated Rx  Requested Prescriptions  Pending Prescriptions Disp Refills   ALPRAZolam (XANAX) 0.5 MG tablet [Pharmacy Med Name: ALPRAZOLAM 0.5 MG TABLET] 30 tablet 0    Sig: TAKE 1 TABLET BY MOUTH EVERY DAY AS NEEDED FOR ANXIETY     Not Delegated - Psychiatry: Anxiolytics/Hypnotics 2 Failed - 05/27/2022 11:50 AM      Failed - This refill cannot be delegated      Failed - Urine Drug Screen completed in last 360 days      Passed - Patient is not pregnant      Passed - Valid encounter within last 6 months    Recent Outpatient Visits           3 months ago Annual physical exam   [facility] Tally Joe T, FNP   10 months ago Type 1 diabetes mellitus with other specified complication [facility])   [facility] Gwyneth Sprout, FNP   1 year ago Annual physical exam   [facility] Bacigalupo, Dionne Bucy, MD   2 years ago Annual physical exam   [facility] Fenton Malling M, [place]   3 years ago Need for shingles vaccine   [place], [place]                 Requested Prescriptions  Pending Prescriptions Disp Refills   ALPRAZolam (XANAX) 0.5 MG tablet [Pharmacy Med Name: ALPRAZOLAM 0.5 MG TABLET] 30 tablet 0    Sig: TAKE 1 TABLET BY MOUTH EVERY DAY AS NEEDED FOR ANXIETY     Not Delegated - Psychiatry: Anxiolytics/Hypnotics 2 Failed - 05/27/2022 11:50 AM      Failed - This refill cannot be delegated      Failed - Urine Drug Screen completed in last 360 days      Passed - Patient is not pregnant      Passed - Valid encounter within last 6 months    Recent Outpatient Visits           3 months ago Annual physical exam   [facility] Tally Joe T, FNP   10 months ago Type 1  diabetes mellitus with other specified complication [facility])   [facility] Gwyneth Sprout, FNP   1 year ago Annual physical exam   [facility] Bacigalupo, Dionne Bucy, MD   2 years ago Annual physical exam   [facility] Fenton Malling M, [place]   3 years ago Need for shingles vaccine   [facility] [place], [place], [place]

## 2022-05-31 ENCOUNTER — Other Ambulatory Visit: Payer: Self-pay | Admitting: Family Medicine

## 2022-05-31 DIAGNOSIS — F419 Anxiety disorder, unspecified: Secondary | ICD-10-CM

## 2022-05-31 MED ORDER — ALPRAZOLAM 0.5 MG PO TABS: 0.5000 mg | ORAL_TABLET | ORAL | 0 refills | Status: DC | PRN

## 2022-06-28 ENCOUNTER — Ambulatory Visit
Admission: RE | Admit: 2022-06-28 | Discharge: 2022-06-28 | Disposition: A | Discharge status: Home / Self Care | Payer: Managed Care, Other (non HMO) | Source: Ambulatory Visit | Attending: Family Medicine | Admitting: Family Medicine

## 2022-06-28 DIAGNOSIS — Z1231 Encounter for screening mammogram for malignant neoplasm of breast: Secondary | ICD-10-CM | POA: Diagnosis present

## 2022-06-30 NOTE — Progress Notes (Signed)
Hi Joane  Normal mammogram; repeat in 1 year.  Please let us know if you have any questions.  Thank you,  Tally Joe, FNP

## 2022-07-23 ENCOUNTER — Encounter
Admission: RE | Disposition: A | Discharge status: Home / Self Care | Payer: Self-pay | Source: Home / Self Care | Attending: Gastroenterology

## 2022-07-23 ENCOUNTER — Ambulatory Visit: Payer: Managed Care, Other (non HMO) | Admitting: Anesthesiology

## 2022-07-23 ENCOUNTER — Ambulatory Visit
Admission: RE | Admit: 2022-07-23 | Discharge: 2022-07-23 | Disposition: A | Discharge status: Home / Self Care | Payer: Managed Care, Other (non HMO) | Attending: Gastroenterology | Admitting: Gastroenterology

## 2022-07-23 ENCOUNTER — Encounter: Payer: Self-pay | Admitting: Gastroenterology

## 2022-07-23 DIAGNOSIS — D120 Benign neoplasm of cecum: Secondary | ICD-10-CM | POA: Diagnosis not present

## 2022-07-23 DIAGNOSIS — Z794 Long term (current) use of insulin: Secondary | ICD-10-CM | POA: Insufficient documentation

## 2022-07-23 DIAGNOSIS — D122 Benign neoplasm of ascending colon: Secondary | ICD-10-CM | POA: Insufficient documentation

## 2022-07-23 DIAGNOSIS — I10 Essential (primary) hypertension: Secondary | ICD-10-CM | POA: Insufficient documentation

## 2022-07-23 DIAGNOSIS — F419 Anxiety disorder, unspecified: Secondary | ICD-10-CM | POA: Insufficient documentation

## 2022-07-23 DIAGNOSIS — D128 Benign neoplasm of rectum: Secondary | ICD-10-CM | POA: Insufficient documentation

## 2022-07-23 DIAGNOSIS — Z1211 Encounter for screening for malignant neoplasm of colon: Secondary | ICD-10-CM | POA: Diagnosis present

## 2022-07-23 DIAGNOSIS — K635 Polyp of colon: Secondary | ICD-10-CM

## 2022-07-23 DIAGNOSIS — K52832 Lymphocytic colitis: Secondary | ICD-10-CM | POA: Insufficient documentation

## 2022-07-23 DIAGNOSIS — Z87891 Personal history of nicotine dependence: Secondary | ICD-10-CM | POA: Insufficient documentation

## 2022-07-23 DIAGNOSIS — K523 Indeterminate colitis: Secondary | ICD-10-CM | POA: Diagnosis not present

## 2022-07-23 DIAGNOSIS — F32A Depression, unspecified: Secondary | ICD-10-CM | POA: Insufficient documentation

## 2022-07-23 DIAGNOSIS — Z86010 Personal history of colonic polyps: Secondary | ICD-10-CM

## 2022-07-23 DIAGNOSIS — D123 Benign neoplasm of transverse colon: Secondary | ICD-10-CM | POA: Insufficient documentation

## 2022-07-23 DIAGNOSIS — E109 Type 1 diabetes mellitus without complications: Secondary | ICD-10-CM | POA: Diagnosis not present

## 2022-07-23 HISTORY — PX: COLONOSCOPY WITH PROPOFOL: SHX5780

## 2022-07-23 LAB — GLUCOSE, CAPILLARY: Glucose-Capillary: 188 mg/dL — ABNORMAL HIGH (ref 70–99)

## 2022-07-23 SURGERY — COLONOSCOPY WITH PROPOFOL
Anesthesia: General

## 2022-07-23 MED ORDER — PHENYLEPHRINE 80 MCG/ML (10ML) SYRINGE FOR IV PUSH (FOR BLOOD PRESSURE SUPPORT)
PREFILLED_SYRINGE | INTRAVENOUS | Status: AC
  Filled 2022-07-23: qty 10

## 2022-07-23 MED ORDER — PROPOFOL 10 MG/ML IV BOLUS
INTRAVENOUS | Status: AC
  Filled 2022-07-23: qty 40

## 2022-07-23 MED ORDER — LIDOCAINE HCL (PF) 2 % IJ SOLN
INTRAMUSCULAR | Status: AC
  Filled 2022-07-23: qty 5

## 2022-07-23 MED ORDER — PROPOFOL 10 MG/ML IV BOLUS
INTRAVENOUS | Status: DC | PRN
  Administered 2022-07-23: 140 ug/kg/min via INTRAVENOUS
  Administered 2022-07-23: 120 mg via INTRAVENOUS

## 2022-07-23 MED ORDER — PROPOFOL 10 MG/ML IV BOLUS
INTRAVENOUS | Status: AC
  Filled 2022-07-23: qty 20

## 2022-07-23 MED ORDER — LIDOCAINE HCL (CARDIAC) PF 100 MG/5ML IV SOSY
PREFILLED_SYRINGE | INTRAVENOUS | Status: DC | PRN
  Administered 2022-07-23: 100 mg via INTRAVENOUS

## 2022-07-23 MED ORDER — PHENYLEPHRINE HCL (PRESSORS) 10 MG/ML IV SOLN
INTRAVENOUS | Status: DC | PRN
  Administered 2022-07-23 (×2): 80 ug via INTRAVENOUS

## 2022-07-23 MED ORDER — SODIUM CHLORIDE 0.9 % IV SOLN: INTRAVENOUS | Status: DC

## 2022-07-23 NOTE — Anesthesia Preprocedure Evaluation (Addendum)
Anesthesia Evaluation  Patient identified by MRN, date of birth, ID band Patient awake    Reviewed: Allergy & Precautions, H&P , NPO status , Patient's Chart, lab work & pertinent test results, reviewed documented beta blocker date and time   Airway Mallampati: II  TM Distance: >3 FB Neck ROM: full    Dental no notable dental hx.    Pulmonary former smoker   Pulmonary exam normal        Cardiovascular hypertension, Pt. on home beta blockers Normal cardiovascular exam  Aortic Valve Repair as child   Neuro/Psych  PSYCHIATRIC DISORDERS Anxiety Depression    negative neurological ROS     GI/Hepatic negative GI ROS, Neg liver ROS,,,  Endo/Other  diabetes, Type 1, Insulin Dependent    Renal/GU negative Renal ROS  negative genitourinary   Musculoskeletal   Abdominal   Peds  Hematology negative hematology ROS (+)   Anesthesia Other Findings Past Medical History: No date: Anxiety No date: Depression No date: Diabetes mellitus without complication (HCC)     Comment:  type 1 No date: Elevated cholesterol No date: Hypertension No date: Hypothyroidism  Past Surgical History: No date: BREAST BIOPSY; Left     Comment:  bx/clip-neg 1963: CARDIAC VALVE SURGERY     Comment:  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 11/06/2015: CARPAL TUNNEL RELEASE; Left     Comment:  Procedure: CARPAL TUNNEL RELEASE;  Surgeon: Hessie Knows, MD;  Location: [facility] ORS;  Service: Orthopedics;                Laterality: Left; 07/14/2017: COLONOSCOPY WITH PROPOFOL; N/A     Comment:  Procedure: COLONOSCOPY WITH PROPOFOL;  Surgeon: Lin Landsman, MD;  Location: [facility] ENDOSCOPY;  Service:               Gastroenterology;  Laterality: N/A; No date: ctr; Right 2010: HAND SURGERY; Right     Comment:  carpal tunnel 03/2000: THYROID LOBECTOMY; Left  BMI    Body Mass Index: 26.61 kg/m       Reproductive/Obstetrics negative OB ROS                             Anesthesia Physical Anesthesia Plan  ASA: 2  Anesthesia Plan: General   Post-op Pain Management:    Induction: Intravenous  PONV Risk Score and Plan: Propofol infusion and TIVA  Airway Management Planned:   Additional Equipment:   Intra-op Plan:   Post-operative Plan:   Informed Consent: I have reviewed the patients History and Physical, chart, labs and discussed the procedure including the risks, benefits and alternatives for the proposed anesthesia with the patient or authorized representative who has indicated his/her understanding and acceptance.     Dental Advisory Given  Plan Discussed with: CRNA and Surgeon  Anesthesia Plan Comments:         Anesthesia Quick Evaluation

## 2022-07-23 NOTE — Anesthesia Postprocedure Evaluation (Signed)
Anesthesia Post Note  Patient: Kristin [name]  Procedure(s) Performed: COLONOSCOPY WITH PROPOFOL  Patient location during evaluation: PACU Anesthesia Type: General Level of consciousness: awake and alert Pain management: pain level controlled Vital Signs Assessment: post-procedure vital signs reviewed and stable Respiratory status: spontaneous breathing, nonlabored ventilation and respiratory function stable Cardiovascular status: blood pressure returned to baseline and stable Postop Assessment: no apparent nausea or vomiting Anesthetic complications: no   No notable events documented.   Last Vitals:  Vitals:   07/23/22 0940 07/23/22 1000  BP: (!) 88/60 (!) 132/59  Pulse:    Resp:    Temp: (!) 36.2 C   SpO2:      Last Pain:  Vitals:   07/23/22 1000  TempSrc:   PainSc: 0-No pain                 [place] Ouch

## 2022-07-23 NOTE — Op Note (Signed)
[facility]astroenterology Patient Name: Kristin [name] Procedure Date: 07/23/2022 8:55 AM MRN: [id number] Account #: [id number] Date of Birth: 1961/02/18 Admit Type: Outpatient Age: 62 Room: [facility] ENDO ROOM 2 Gender: Female Note Status: Finalized Instrument Name: Jasper Riling [id number] Procedure:             Colonoscopy Indications:           Surveillance: Personal history of adenomatous polyps                         on last colonoscopy 5 years ago, Last colonoscopy:                         February 2019 Providers:             Lin Landsman MD, MD Referring MD:          Jaci Standard. Rollene Rotunda (Referring MD) Medicines:             General Anesthesia Complications:         No immediate complications. Estimated blood loss: None. Procedure:             Pre-Anesthesia Assessment:                        - Prior to the procedure, a History and Physical was                         performed, and patient medications and allergies were                         reviewed. The patient is competent. The risks and                         benefits of the procedure and the sedation options and                         risks were discussed with the patient. All questions                         were answered and informed consent was obtained.                         Patient identification and proposed procedure were                         verified by the physician, the nurse, the                         anesthesiologist, the anesthetist and the technician                         in the pre-procedure area in the procedure room in the                         endoscopy suite. Mental Status Examination: alert and                         oriented. Airway Examination: normal oropharyngeal  airway and neck mobility. Respiratory Examination:                         clear to auscultation. CV Examination: normal.                         Prophylactic Antibiotics: The  patient does not require                         prophylactic antibiotics. Prior Anticoagulants: The                         patient has taken no anticoagulant or antiplatelet                         agents. ASA Grade Assessment: II - A patient with mild                         systemic disease. After reviewing the risks and                         benefits, the patient was deemed in satisfactory                         condition to undergo the procedure. The anesthesia                         plan was to use general anesthesia. Immediately prior                         to administration of medications, the patient was                         re-assessed for adequacy to receive sedatives. The                         heart rate, respiratory rate, oxygen saturations,                         blood pressure, adequacy of pulmonary ventilation, and                         response to care were monitored throughout the                         procedure. The physical status of the patient was                         re-assessed after the procedure.                        After obtaining informed consent, the colonoscope was                         passed under direct vision. Throughout the procedure,                         the patient's blood pressure, pulse, and oxygen  saturations were monitored continuously. The                         Colonoscope was introduced through the anus and                         advanced to the the terminal ileum, with                         identification of the appendiceal orifice and IC                         valve. The colonoscopy was performed without                         difficulty. The patient tolerated the procedure well.                         The quality of the bowel preparation was evaluated                         using the BBPS [facility] Bowel Preparation Scale) with                         scores of: Right Colon = 3,  Transverse Colon = 3 and                         Left Colon = 3 (entire mucosa seen well with no                         residual staining, small fragments of stool or opaque                         liquid). The total BBPS score equals 9. The terminal                         ileum, ileocecal valve, appendiceal orifice, and                         rectum were photographed. Findings:      The perianal and digital rectal examinations were normal. Pertinent       negatives include normal sphincter tone and no palpable rectal lesions.      The terminal ileum appeared normal. Biopsies were taken with a cold       forceps for histology.      Six sessile polyps were found in the proximal rectum, transverse colon,       ascending colon and cecum. The polyps were 3 to 9 mm in size. These       polyps were removed with a cold snare. Resection and retrieval were       complete. Estimated blood loss: none.      Normal mucosa was found in the left colon and in the right colon.       Biopsies were taken with a cold forceps for histology.      The retroflexed view of the distal rectum and anal verge was normal and       showed no anal or rectal abnormalities. Impression:            -  The examined portion of the ileum was normal.                         Biopsied.                        - Six 3 to 9 mm polyps in the proximal rectum, in the                         transverse colon, in the ascending colon and in the                         cecum, removed with a cold snare. Resected and                         retrieved.                        - Normal mucosa in the left colon and in the right                         colon. Biopsied.                        - The distal rectum and anal verge are normal on                         retroflexion view. Recommendation:        - Discharge patient to home (with escort).                        - Resume previous diet today.                        - Continue present  medications.                        - Await pathology results.                        - Repeat colonoscopy in 3 years for surveillance of                         multiple polyps. Procedure Code(s):     --- Professional ---                        [phone], Colonoscopy, flexible; with removal of                         tumor(s), polyp(s), or other lesion(s) by snare                         technique                        [id number], 29, Colonoscopy, flexible; with biopsy, single                         or multiple Diagnosis Code(s):     --- Professional ---  Z86.010, Personal history of colonic polyps                        D12.8, Benign neoplasm of rectum                        D12.3, Benign neoplasm of transverse colon (hepatic                         flexure or splenic flexure)                        D12.2, Benign neoplasm of ascending colon                        D12.0, Benign neoplasm of cecum CPT copyright 2022 American Medical Association. All rights reserved. The codes documented in this report are preliminary and upon coder review may  be revised to meet current compliance requirements. Dr. Ulyess Mort Lin Landsman MD, MD 07/23/2022 9:36:56 AM This report has been signed electronically. Number of Addenda: 0 Note Initiated On: 07/23/2022 8:55 AM Scope Withdrawal Time: 0 hours 27 minutes 32 seconds  Total Procedure Duration: 0 hours 31 minutes 1 second  Estimated Blood Loss:  Estimated blood loss: none.      [facility]

## 2022-07-23 NOTE — H&P (Signed)
Cephas Darby, MD [address]  [place]  [place], [place] [address]  Main: [phone]  Fax: [phone] Pager: [phone]  Primary Care Physician:  Gwyneth Sprout, FNP Primary Gastroenterologist:  Dr. Cephas Darby  Pre-Procedure History & Physical: HPI:  Kristin [name] is a 62 y.o. female is here for an colonoscopy.   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 Authorizing Provider  ALPRAZolam Duanne Moron) 0.5 MG tablet Take 1 tablet (0.5 mg total) by mouth as needed for anxiety. In office appt/virtual appt required for recurring refills 05/31/22  Yes Tally Joe T, FNP  aspirin 81 MG tablet Take 81 mg by mouth daily.   Yes [provider]  atorvastatin (LIPITOR) 40 MG tablet TAKE 1 TABLET BY MOUTH AT  BEDTIME 03/22/22  Yes Tally Joe T, FNP  Cholecalciferol (VITAMIN D3) 2000 units TABS Take 2 capsules by mouth daily.   Yes [provider]  Continuous Blood Gluc Transmit (DEXCOM G6 TRANSMITTER) MISC Change every 90 days 04/03/21  Yes [provider]  HUMALOG KWIKPEN 100 UNIT/ML KwikPen Inject into the skin. 07/16/21  Yes  [provider]  insulin glargine (LANTUS) 100 UNIT/ML Solostar Pen Inject 44 Units into the skin daily at 10 pm.    Yes [provider]  levothyroxine (SYNTHROID) 150 MCG tablet Take 150 mcg by mouth daily. 05/28/21  Yes [provider]  losartan (COZAAR) 50 MG tablet TAKE 1 TABLET BY MOUTH  DAILY 06/01/21  Yes Bacigalupo, Dionne Bucy, MD  metoprolol succinate (TOPROL-XL) 25 MG 24 hr tablet TAKE 1 TABLET BY MOUTH  DAILY 09/22/21  Yes Tally Joe T, FNP  sertraline (ZOLOFT) 100 MG tablet TAKE 1 TABLET BY MOUTH DAILY 02/22/22  Yes Gwyneth Sprout, FNP  Continuous Blood Gluc Sensor (DEXCOM G6 SENSOR) MISC Change every 10 days 05/29/21   [provider]  OZEMPIC, 1 MG/DOSE, 4 MG/3ML SOPN Inject into the skin. 07/20/21   [provider]    Allergies as of 05/21/2022 - Review Complete 05/21/2022  Allergen Reaction Noted   Asacol  [mesalamine]  01/30/2015   Lisinopril Cough 01/30/2015    Family History  Problem Relation Age of Onset   Alcohol abuse Mother    Anxiety disorder Mother    Hypothyroidism Mother    Alcohol abuse Sister    Anxiety disorder Sister    Bipolar disorder Sister    Alcohol abuse Brother    Depression Maternal Grandmother    Stroke Maternal Grandmother    Alcohol abuse Maternal Grandfather    Heart disease Maternal  Grandfather    Healthy Brother    Breast cancer Neg Hx     Social History   Socioeconomic History   Marital status: Widowed    Spouse name: Not on file   Number of children: 0   Years of education: College   Highest education level: Not on file  Occupational History   Not on file  Tobacco Use   Smoking status: Former    Packs/day: 1.00    Years: 33.00    Total pack years: 33.00    Types: Cigarettes    Quit date: 05/24/2008    Years since quitting: 14.1   Smokeless tobacco: Never  Vaping Use   Vaping Use: Never used  Substance and Sexual Activity   Alcohol use: Yes    Comment: Occasional alcohol use   Drug  use: No   Sexual activity: Not on file  Other Topics Concern   Not on file  Social History Narrative   Not on file   Social Determinants of Health   Financial Resource Strain: Not on file  Food Insecurity: Not on file  Transportation Needs: Not on file  Physical Activity: Not on file  Stress: Not on file  Social Connections: Not on file  Intimate Partner Violence: Not on file    Review of Systems: See HPI, otherwise negative ROS  Physical Exam: BP (!) 157/88   Pulse (!) 103   Temp (!) 97.3 F (36.3 C) (Temporal)   Resp 16   Ht '5\' 8"'$  (1.727 m)   Wt 79.4 kg   SpO2 100%   BMI 26.61 kg/m  General:   Alert,  pleasant and cooperative in NAD Head:  Normocephalic and atraumatic. Neck:  Supple; no masses or thyromegaly. Lungs:  Clear throughout to auscultation.    Heart:  Regular rate and rhythm. Abdomen:  Soft, nontender and nondistended. Normal bowel sounds, without guarding, and without rebound.   Neurologic:  Alert and  oriented x4;  grossly normal neurologically.  Impression/Plan: Kristin [name] is here for an colonoscopy to be performed for h/o colon adenomas  Risks, benefits, limitations, and alternatives regarding  colonoscopy have been reviewed with the patient.  Questions have been answered.  All parties agreeable.   Kristin Sear, MD  07/23/2022, 8:22 AM

## 2022-07-23 NOTE — Transfer of Care (Signed)
Immediate Anesthesia Transfer of Care Note  Patient: Kristin [name]  Procedure(s) Performed: COLONOSCOPY WITH PROPOFOL  Patient Location: PACU  Anesthesia Type:General  Level of Consciousness: awake  Airway & Oxygen Therapy: Patient Spontanous Breathing  Post-op Assessment: Report given to RN and Post -op Vital signs reviewed and stable  Post vital signs: Reviewed and stable  Last Vitals:  Vitals Value Taken Time  BP 90/59 0939  Temp 35.8 0940  Pulse 59 07/23/22 0940  Resp 17 07/23/22 0940  SpO2 100 % 07/23/22 0940  Vitals shown include unvalidated device data.  Last Pain:  Vitals:   07/23/22 0816  TempSrc: Temporal  PainSc: 0-No pain         Complications: No notable events documented.

## 2022-07-26 ENCOUNTER — Encounter: Payer: Self-pay | Admitting: Gastroenterology

## 2022-07-26 LAB — SURGICAL PATHOLOGY

## 2022-07-27 ENCOUNTER — Telehealth: Payer: Self-pay

## 2022-07-27 ENCOUNTER — Other Ambulatory Visit: Payer: Self-pay | Admitting: Family Medicine

## 2022-07-27 ENCOUNTER — Encounter: Payer: Self-pay | Admitting: Gastroenterology

## 2022-07-27 DIAGNOSIS — F3341 Major depressive disorder, recurrent, in partial remission: Secondary | ICD-10-CM

## 2022-07-27 MED ORDER — BUDESONIDE 3 MG PO CPEP: ORAL_CAPSULE | ORAL | 0 refills | Status: DC

## 2022-07-27 NOTE — Telephone Encounter (Signed)
Patient verbalized understanding of results. She is still having diarrhea. Made 1 month follow up appointment

## 2022-07-27 NOTE — Telephone Encounter (Signed)
-----   Message from Lin Landsman, MD sent at 07/27/2022  4:14 PM EST ----- Kristin [name]  Please inform patient that the pathology results from her colonoscopy revealed that she has lymphocytic colitis which is a special type of chronic inflammation in the colon that leads to diarrhea.  This is a benign condition and does not increase her risk for cancer.  However, if she is having diarrhea we can treat with short course of budesonide which is a type of steroid 3 mg 3 pills daily for 1 month followed by taper if patient is agreeable  Please arrange follow-up visit to see me next 1 month, okay to [employer]

## 2022-07-28 NOTE — Telephone Encounter (Signed)
Requested Prescriptions  Pending Prescriptions Disp Refills   sertraline (ZOLOFT) 100 MG tablet [Pharmacy Med Name: Sertraline HCl 100 MG Oral Tablet] 90 tablet 0    Sig: TAKE 1 TABLET BY MOUTH DAILY     Psychiatry:  Antidepressants - SSRI - sertraline Failed - 07/27/2022  6:42 PM      Failed - AST in normal range and within 360 days    AST  Date Value Ref Range Status  02/05/2021 21 0 - 40 IU/L Final         Failed - ALT in normal range and within 360 days    ALT  Date Value Ref Range Status  02/05/2021 27 0 - 32 IU/L Final         Passed - Completed PHQ-2 or PHQ-9 in the last 360 days      Passed - Valid encounter within last 6 months    Recent Outpatient Visits           5 months ago Annual physical exam   [place] Tally Joe T, FNP   1 year ago Type 1 diabetes mellitus with other specified complication [facility])   [place] Gwyneth Sprout, FNP   1 year ago Annual physical exam   [place] Brita Romp, Dionne Bucy, MD   2 years ago Annual physical exam   [facility] Fenton Malling M, [place]   3 years ago Need for shingles vaccine   [facility] [place], Clearnce Sorrel, [place]       Future Appointments             In 1 month Vanga, Tally Due, MD [place] [facility]

## 2022-07-28 NOTE — Telephone Encounter (Signed)
Requested Prescriptions  Pending Prescriptions Disp Refills   losartan (COZAAR) 50 MG tablet [Pharmacy Med Name: Losartan Potassium 50 MG Oral Tablet] 90 tablet 0    Sig: TAKE 1 TABLET BY MOUTH DAILY     Cardiovascular:  Angiotensin Receptor Blockers Failed - 07/27/2022  6:42 PM      Failed - Cr in normal range and within 180 days    Creatinine, Ser  Date Value Ref Range Status  11/20/2019 0.67 0.57 - 1.00 mg/dL Final         Failed - K in normal range and within 180 days    Potassium  Date Value Ref Range Status  11/20/2019 4.5 3.5 - 5.2 mmol/L Final         Passed - Patient is not pregnant      Passed - Last BP in normal range    BP Readings from Last 1 Encounters:  07/23/22 (!) 132/59         Passed - Valid encounter within last 6 months    Recent Outpatient Visits           5 months ago Annual physical exam   [place] Tally Joe T, FNP   1 year ago Type 1 diabetes mellitus with other specified complication [facility])   [place] Gwyneth Sprout, FNP   1 year ago Annual physical exam   [place] Brita Romp, Dionne Bucy, MD   2 years ago Annual physical exam   [facility] Fenton Malling M, [place]   3 years ago Need for shingles vaccine   [facility] [place], Clearnce Sorrel, [place]       Future Appointments             In 1 month Vanga, Tally Due, MD [place] [facility] at [facility]

## 2022-08-30 ENCOUNTER — Ambulatory Visit: Payer: Managed Care, Other (non HMO) | Admitting: Gastroenterology

## 2022-08-30 ENCOUNTER — Encounter: Payer: Self-pay | Admitting: Gastroenterology

## 2022-08-30 VITALS — BP 119/78 | HR 74 | Temp 98.5°F | Ht 68.0 in | Wt 180.1 lb

## 2022-08-30 DIAGNOSIS — K52832 Lymphocytic colitis: Secondary | ICD-10-CM | POA: Diagnosis not present

## 2022-08-30 NOTE — Progress Notes (Signed)
Arlyss Repress, MD [address]  [address]  [place], [place] [address]  Main: [phone]  Fax: [phone]    Gastroenterology Consultation  Referring Provider:     Jacky Kindle, FNP Primary Care Physician:  Jacky Kindle, FNP Primary Gastroenterologist:  Dr. Arlyss Repress Reason for Consultation: Lymphocytic colitis        HPI:   Kristin [name] is a 62 y.o. female referred by Jacky Kindle, FNP  for consultation & management of lymphocytic colitis.  Patient recently underwent surveillance colonoscopy for history of adenomas of the colon, pathology revealed lymphocytic colitis on random colon biopsies.  Therefore, restarted her on budesonide, 3 pills resulted in severe constipation, decreased to 2 pills, currently taking 1 pill a day which leads to 1 formed bowel movement daily.  Patient does not have any other concerns today Patient was previously diagnosed with collagenous colitis  NSAIDs: None  Antiplts/Anticoagulants/Anti thrombotics: None  GI Procedures:  Colonoscopy 07/23/2022 for personal history of colon polyps, found to have normal ileum, six 3 to 9 mm polyps that were removed with cold snare Random colon biopsies were performed because of diarrhea, pathology revealed lymphocytic colitis  Past Medical History:  Diagnosis Date   Anxiety    Depression    Diabetes mellitus without complication    type 1   Elevated cholesterol    Hypertension    Hypothyroidism     Past Surgical History:  Procedure Laterality Date   BREAST BIOPSY Left    bx/clip-neg   CARDIAC VALVE SURGERY  1963   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   CARPAL TUNNEL RELEASE Left 11/06/2015   Procedure: CARPAL TUNNEL RELEASE;  Surgeon: Kennedy Bucker, MD;  Location: [facility] ORS;  Service: Orthopedics;  Laterality: Left;   COLONOSCOPY WITH PROPOFOL N/A 07/14/2017   Procedure: COLONOSCOPY WITH PROPOFOL;  Surgeon: Toney Reil, MD;  Location[facility] END[facility]COPY;  Service:  Gastroenterology;  Laterality: N/A;   COLONOSCOPY WITH PROPOFOL N/A 07/23/2022   Procedure: COLONOSCOPY WITH PROPOFOL;  Surgeon: Toney Reil, MD;  Location: [facility] ENDOSCOPY;  Service: Gastroenterology;  Laterality: N/A;   ctr Right    HAND SURGERY Right 2010   carpal tunnel   THYROID LOBECTOMY Left 03/2000     Current Outpatient Medications:    ALPRAZolam (XANAX) 0.5 MG tablet, Take 1 tablet (0.5 mg total) by mouth as needed for anxiety. In office appt/virtual appt required for recurring refills, Disp: 15 tablet, Rfl: 0   aspirin 81 MG tablet, Take 81 mg by mouth daily., Disp: , Rfl:    atorvastatin (LIPITOR) 40 MG tablet, TAKE 1 TABLET BY MOUTH AT  BEDTIME, Disp: 90 tablet, Rfl: 3   budesonide (ENTOCORT EC) 3 MG 24 hr capsule, Take 3 capsules (9 mg total) by mouth daily for 30 days, THEN 2 capsules (6 mg total) daily for 14 days, THEN 1 capsule (3 mg total) daily for 14 days., Disp: 132 capsule, Rfl: 0   Cholecalciferol (VITAMIN D3) 2000 units TABS, Take 2 capsules by mouth daily., Disp: , Rfl:    Continuous Blood Gluc Sensor (DEXCOM G6 SENSOR) MISC, Change every 10 days, Disp: , Rfl:    Continuous Blood Gluc Transmit (DEXCOM G6 TRANSMITTER) MISC, Change every 90 days, Disp: , Rfl:    HUMALOG KWIKPEN 100 UNIT/ML KwikPen, Inject into the skin., Disp: , Rfl:    insulin glargine (LANTUS) 100 UNIT/ML Solostar Pen, Inject 44 Units into the skin daily at 10 pm. , Disp: ,  Rfl:    levothyroxine (SYNTHROID) 150 MCG tablet, Take 150 mcg by mouth daily., Disp: , Rfl:    losartan (COZAAR) 50 MG tablet, TAKE 1 TABLET BY MOUTH DAILY, Disp: 90 tablet, Rfl: 0   metoprolol succinate (TOPROL-XL) 25 MG 24 hr tablet, TAKE 1 TABLET BY MOUTH  DAILY, Disp: 90 tablet, Rfl: 3   OZEMPIC, 1 MG/DOSE, 4 MG/3ML SOPN, Inject into the skin., Disp: , Rfl:    sertraline (ZOLOFT) 100 MG tablet, TAKE 1 TABLET BY MOUTH DAILY, Disp: 90 tablet, Rfl: 0   Family History  Problem Relation Age of Onset   Alcohol abuse  Mother    Anxiety disorder Mother    Hypothyroidism Mother    Alcohol abuse Sister    Anxiety disorder Sister    Bipolar disorder Sister    Alcohol abuse Brother    Depression Maternal Grandmother    Stroke Maternal Grandmother    Alcohol abuse Maternal Grandfather    Heart disease Maternal Grandfather    Healthy Brother    Breast cancer Neg Hx      Social History   Tobacco Use   Smoking status: Former    Packs/day: 1.00    Years: 33.00    Additional pack years: 0.00    Total pack years: 33.00    Types: Cigarettes    Quit date: 05/24/2008    Years since quitting: 14.2   Smokeless tobacco: Never  Vaping Use   Vaping Use: Never used  Substance Use Topics   Alcohol use: Yes    Comment: Occasional alcohol use   Drug use: No    Allergies as of 08/30/2022 - Review Complete 08/30/2022  Allergen Reaction Noted   Asacol  [mesalamine]  01/30/2015   Lisinopril Cough 01/30/2015    Review of Systems:    All systems reviewed and negative except where noted in HPI.   Physical Exam:  BP 119/78 (BP Location: Left Arm, Patient Position: Sitting, Cuff Size: Normal)   Pulse 74   Temp 98.5 F (36.9 C) (Oral)   Ht 5\' 8"  (1.727 m)   Wt 180 lb 2 oz (81.7 kg)   BMI 27.39 kg/m  No LMP recorded. Patient is postmenopausal.  General:   Alert,  Well-developed, well-nourished, pleasant and cooperative in NAD Head:  Normocephalic and atraumatic. Eyes:  Sclera clear, no icterus.   Conjunctiva pink. Ears:  Normal auditory acuity. Nose:  No deformity, discharge, or lesions. Mouth:  No deformity or lesions,oropharynx pink & moist. Neck:  Supple; no masses or thyromegaly. Lungs:  Respirations even and unlabored.  Clear throughout to auscultation.   No wheezes, crackles, or rhonchi. No acute distress. Heart:  Regular rate and rhythm; no murmurs, clicks, rubs, or gallops. Abdomen:  Normal bowel sounds. Soft, non-tender and non-distended without masses, hepatosplenomegaly or hernias noted.  No  guarding or rebound tenderness.   Rectal: Not performed Msk:  Symmetrical without gross deformities. Good, equal movement & strength bilaterally. Pulses:  Normal pulses noted. Extremities:  No clubbing or edema.  No cyanosis. Neurologic:  Alert and oriented x3;  grossly normal neurologically. Skin:  Intact without significant lesions or rashes. No jaundice. Psych:  Alert and cooperative. Normal mood and affect.  Imaging Studies: No abdominal imaging  Assessment and Plan:   Kristin [name] is a 62 y.o. pleasant Caucasian female with diabetes, on Ozempic, hypertension, hypothyroidism prior history of collagenous colitis, recently diagnosed with lymphocytic colitis  Lymphocytic colitis Continue budesonide 3 mg 1 pill daily Check celiac disease panel  Patient is currently following weight watchers  Follow up as needed   Arlyss Repressohini R Jerney Baksh, MD

## 2022-08-31 ENCOUNTER — Encounter: Payer: Self-pay | Admitting: Gastroenterology

## 2022-09-01 LAB — CELIAC DISEASE PANEL
Endomysial IgA: NEGATIVE
IgA/Immunoglobulin A, Serum: 198 mg/dL (ref 87–352)
Transglutaminase IgA: <2 U/mL (ref 0–3)

## 2022-09-21 ENCOUNTER — Other Ambulatory Visit: Payer: Self-pay | Admitting: Family Medicine

## 2022-09-21 DIAGNOSIS — F3341 Major depressive disorder, recurrent, in partial remission: Secondary | ICD-10-CM

## 2022-09-22 NOTE — Telephone Encounter (Signed)
Requested Prescriptions  Pending Prescriptions Disp Refills   losartan (COZAAR) 50 MG tablet [Pharmacy Med Name: Losartan Potassium 50 MG Oral Tablet] 90 tablet 3    Sig: TAKE 1 TABLET BY MOUTH DAILY     Cardiovascular:  Angiotensin Receptor Blockers Failed - 09/21/2022 10:48 PM      Failed - Cr in normal range and within 180 days    Creatinine, Ser  Date Value Ref Range Status  11/20/2019 0.67 0.57 - 1.00 mg/dL Final         Failed - K in normal range and within 180 days    Potassium  Date Value Ref Range Status  11/20/2019 4.5 3.5 - 5.2 mmol/L Final         Failed - Valid encounter within last 6 months    Recent Outpatient Visits           7 months ago Annual physical exam   [facility] Merita Norton T, FNP   1 year ago Type 1 diabetes mellitus with other specified complication [facility])   [facility] Jacky Kindle, FNP   1 year ago Annual physical exam   [facility] Beryle Flock, Marzella Schlein, MD   2 years ago Annual physical exam   [facility] Joycelyn Man M, [place]   3 years ago Need for shingles vaccine   [facility] Joycelyn Man M, [place]              Passed - Patient is not pregnant      Passed - Last BP in normal range    BP Readings from Last 1 Encounters:  08/30/22 119/78          sertraline (ZOLOFT) 100 MG tablet [Pharmacy Med Name: Sertraline HCl 100 MG Oral Tablet] 90 tablet 3    Sig: TAKE 1 TABLET BY MOUTH DAILY     Psychiatry:  Antidepressants - SSRI - sertraline Failed - 09/21/2022 10:48 PM      Failed - AST in normal range and within 360 days    AST  Date Value Ref Range Status  02/05/2021 21 0 - 40 IU/L Final         Failed - ALT in normal range and within 360 days    ALT  Date Value Ref Range Status  02/05/2021 27 0 - 32 IU/L Final         Failed - Valid encounter within last 6 months    Recent  Outpatient Visits           7 months ago Annual physical exam   [facility] Merita Norton T, FNP   1 year ago Type 1 diabetes mellitus with other specified complication [facility])   [facility] Jacky Kindle, FNP   1 year ago Annual physical exam    [facility] Beryle Flock, Marzella Schlein, MD   2 years ago Annual physical exam   [facility] Joycelyn Man M, [place]   3 years ago Need for shingles vaccine   [facility] [place], Victorino Dike M, [place]              Passed - Completed PHQ-2 or PHQ-9 in the last 360 days

## 2022-10-08 ENCOUNTER — Other Ambulatory Visit: Payer: Self-pay | Admitting: Family Medicine

## 2022-10-08 DIAGNOSIS — F3341 Major depressive disorder, recurrent, in partial remission: Secondary | ICD-10-CM

## 2022-10-11 NOTE — Telephone Encounter (Signed)
Requested medication (s) are due for refill today - no  Requested medication (s) are on the active medication list -yes  Future visit scheduled -no  Last refill: 07/28/22 #90  Notes to clinic: fails lab protocol- over 1 year-11/20/19 Requested Prescriptions  Pending Prescriptions Disp Refills   losartan (COZAAR) 50 MG tablet [Pharmacy Med Name: Losartan Potassium 50 MG Oral Tablet] 90 tablet 3    Sig: TAKE 1 TABLET BY MOUTH DAILY     Cardiovascular:  Angiotensin Receptor Blockers Failed - 10/08/2022 10:32 PM      Failed - Cr in normal range and within 180 days    Creatinine, Ser  Date Value Ref Range Status  11/20/2019 0.67 0.57 - 1.00 mg/dL Final         Failed - K in normal range and within 180 days    Potassium  Date Value Ref Range Status  11/20/2019 4.5 3.5 - 5.2 mmol/L Final         Failed - Valid encounter within last 6 months    Recent Outpatient Visits           8 months ago Annual physical exam   [facility] Norton T, FNP   1 year ago Type 1 diabetes mellitus with other specified complication [facility])   [facility] Jacky Kindle, FNP   1 year ago Annual physical exam   [facility] Beryle Flock, Marzella Schlein, MD   2 years ago Annual physical exam   [facility] Joycelyn Man M, [place]   3 years ago Need for shingles vaccine   [facility] Joycelyn Man M, [place]              Passed - Patient is not pregnant      Passed - Last BP in normal range    BP Readings from Last 1 Encounters:  08/30/22 119/78          sertraline (ZOLOFT) 100 MG tablet [Pharmacy Med Name: Sertraline HCl 100 MG Oral Tablet] 90 tablet 3    Sig: TAKE 1 TABLET BY MOUTH DAILY     Psychiatry:  Antidepressants - SSRI - sertraline Failed - 10/08/2022 10:32 PM      Failed - AST in normal range and within 360 days    AST  Date Value Ref Range Status   02/05/2021 21 0 - 40 IU/L Final         Failed - ALT in normal range and within 360 days    ALT  Date Value Ref Range Status  02/05/2021 27 0 - 32 IU/L Final         Failed - Valid encounter within last 6 months    Recent Outpatient Visits           8 months ago Annual physical exam   [facility] Merita Norton T, FNP   1 year ago Type 1 diabetes mellitus with other specified complication [facility])   [facility] Jacky Kindle, FNP   1 year ago Annual physical exam   [facility] Beryle Flock, Marzella Schlein, MD   2 years ago Annual physical exam   [facility] Joycelyn Man M, [place]   3 years ago Need for shingles vaccine   [facility] [place], [place], [place]  Passed - Completed PHQ-2 or PHQ-9 in the last 360 days         Requested Prescriptions  Pending Prescriptions Disp Refills   losartan (COZAAR) 50 MG tablet [Pharmacy Med Name: Losartan Potassium 50 MG Oral Tablet] 90 tablet 3    Sig: TAKE 1 TABLET BY MOUTH DAILY     Cardiovascular:  Angiotensin Receptor Blockers Failed - 10/08/2022 10:32 PM      Failed - Cr in normal range and within 180 days    Creatinine, Ser  Date Value Ref Range Status  11/20/2019 0.67 0.57 - 1.00 mg/dL Final         Failed - K in normal range and within 180 days    Potassium  Date Value Ref Range Status  11/20/2019 4.5 3.5 - 5.2 mmol/L Final         Failed - Valid encounter within last 6 months    Recent Outpatient Visits           8 months ago Annual physical exam   [facility] Mer[facility] Norton T, FNP   1 year ago Type 1 diabetes mellitus with other specified complication [facility])   [facility] Jacky Kindle, FNP   1 year ago Annual physical exam   [facility] Beryle Flock, Marzella Schlein, MD   2 years ago Annual physical  exam   [facility] Joycelyn Man M, [place]   3 years ago Need for shingles vaccine   [facility] Joycelyn Man M, [place]              Passed - Patient is not pregnant      Passed - Last BP in normal range    BP Readings from Last 1 Encounters:  08/30/22 119/78          sertraline (ZOLOFT) 100 MG tablet [Pharmacy Med Name: Sertraline HCl 100 MG Oral Tablet] 90 tablet 3    Sig: TAKE 1 TABLET BY MOUTH DAILY     Psychiatry:  Antidepressants - SSRI - sertraline Failed - 10/08/2022 10:32 PM      Failed - AST in normal range and within 360 days    AST  Date Value Ref Range Status  02/05/2021 21 0 - 40 IU/L Final         Failed - ALT in normal range and within 360 days    ALT  Date Value Ref Range Status  02/05/2021 27 0 - 32 IU/L Final         Failed - Valid encounter within last 6 months    Recent Outpatient Visits           8 months ago Annual physical exam   [place] [facility] Mer[facility] Norton T, FNP   1 year ago Type 1 diabetes mellitus with other specified complication [facility])   [facility] Jacky Kindle, FNP   1 year ago Annual physical exam    [facility] Beryle Flock, Marzella Schlein, MD   2 years ago Annual physical exam   [facility] Joycelyn Man M, [place]   3 years ago Need for shingles vaccine   [facility] [place], Victorino Dike M, [place]              Passed - Completed PHQ-2 or PHQ-9 in the last 360 days

## 2022-10-13 ENCOUNTER — Other Ambulatory Visit: Payer: Self-pay | Admitting: Family Medicine

## 2022-10-13 DIAGNOSIS — I10 Essential (primary) hypertension: Secondary | ICD-10-CM

## 2022-10-21 LAB — BASIC METABOLIC PANEL
BUN: 15 (ref 4–21)
CO2: 29 — AB (ref 13–22)
Chloride: 104 (ref 99–108)
Creatinine: 0.8 (ref 0.5–1.1)
Glucose: 117
Potassium: 3.8 mEq/L (ref 3.5–5.1)
Sodium: 141 (ref 137–147)

## 2022-10-21 LAB — LIPID PANEL
Cholesterol: 152 (ref 0–200)
HDL: 69 (ref 35–70)
LDL Cholesterol: 69
LDl/HDL Ratio: 2.2
Triglycerides: 69 (ref 40–160)

## 2022-10-21 LAB — PROTEIN / CREATININE RATIO, URINE
Albumin, U: 10
Creatinine, Urine: 0.8

## 2022-10-21 LAB — MICROALBUMIN / CREATININE URINE RATIO: Microalb Creat Ratio: 3.8

## 2022-10-21 LAB — HEMOGLOBIN A1C: Hemoglobin A1C: 5.7

## 2022-10-21 LAB — COMPREHENSIVE METABOLIC PANEL: Calcium: 8.9 (ref 8.7–10.7)

## 2022-10-26 ENCOUNTER — Other Ambulatory Visit: Payer: Self-pay | Admitting: Gastroenterology

## 2022-10-28 ENCOUNTER — Encounter: Payer: Self-pay | Admitting: Family Medicine

## 2022-10-28 ENCOUNTER — Ambulatory Visit: Payer: Managed Care, Other (non HMO) | Admitting: Family Medicine

## 2022-10-28 VITALS — BP 134/79 | HR 77 | Temp 98.7°F | Resp 14 | Ht 68.0 in | Wt 174.1 lb

## 2022-10-28 DIAGNOSIS — F3341 Major depressive disorder, recurrent, in partial remission: Secondary | ICD-10-CM

## 2022-10-28 DIAGNOSIS — E1069 Type 1 diabetes mellitus with other specified complication: Secondary | ICD-10-CM

## 2022-10-28 DIAGNOSIS — I152 Hypertension secondary to endocrine disorders: Secondary | ICD-10-CM

## 2022-10-28 DIAGNOSIS — F17201 Nicotine dependence, unspecified, in remission: Secondary | ICD-10-CM

## 2022-10-28 DIAGNOSIS — E1159 Type 2 diabetes mellitus with other circulatory complications: Secondary | ICD-10-CM | POA: Diagnosis not present

## 2022-10-28 DIAGNOSIS — F410 Panic disorder [episodic paroxysmal anxiety] without agoraphobia: Secondary | ICD-10-CM | POA: Diagnosis not present

## 2022-10-28 DIAGNOSIS — E785 Hyperlipidemia, unspecified: Secondary | ICD-10-CM

## 2022-10-28 MED ORDER — LOSARTAN POTASSIUM 50 MG PO TABS: 50.0000 mg | ORAL_TABLET | Freq: Every day | ORAL | 3 refills | Status: DC

## 2022-10-28 MED ORDER — ALPRAZOLAM 0.5 MG PO TABS
0.5000 mg | ORAL_TABLET | ORAL | 5 refills | Status: DC | PRN
Start: 2022-10-28 — End: 2023-08-22

## 2022-10-28 MED ORDER — METOPROLOL SUCCINATE ER 25 MG PO TB24: 25.0000 mg | ORAL_TABLET | Freq: Every day | ORAL | 3 refills | Status: DC

## 2022-10-28 MED ORDER — ATORVASTATIN CALCIUM 80 MG PO TABS
80.0000 mg | ORAL_TABLET | Freq: Every day | ORAL | 3 refills | Status: DC
Start: 2022-10-28 — End: 2022-11-04

## 2022-10-28 MED ORDER — SERTRALINE HCL 100 MG PO TABS: 100.0000 mg | ORAL_TABLET | Freq: Every day | ORAL | 3 refills | Status: DC
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
## 2022-10-28 NOTE — Assessment & Plan Note (Signed)
Acute on chronic, request for additional xanax to assist Low dose PDMP reviewed
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
## 2022-10-28 NOTE — Assessment & Plan Note (Signed)
Recommend low dose CT scan  

## 2022-10-28 NOTE — Assessment & Plan Note (Signed)
Chronic, borderline Increase lipitor from 40 to 80 mg Repeat LP at CPE LDL goal of 55-70

## 2022-10-29 ENCOUNTER — Other Ambulatory Visit: Payer: Self-pay

## 2022-10-29 DIAGNOSIS — F3341 Major depressive disorder, recurrent, in partial remission: Secondary | ICD-10-CM

## 2022-10-29 DIAGNOSIS — I152 Hypertension secondary to endocrine disorders: Secondary | ICD-10-CM

## 2022-10-29 DIAGNOSIS — E1159 Type 2 diabetes mellitus with other circulatory complications: Secondary | ICD-10-CM

## 2022-10-29 DIAGNOSIS — E1069 Type 1 diabetes mellitus with other specified complication: Secondary | ICD-10-CM

## 2022-10-29 NOTE — Telephone Encounter (Signed)
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  Are you ok with [place] sending to another pharmacy?    Last one, I did not [facility], but I need that refill sent to [facility] as well. I got my last refill for that one in the mail today. [name]  P [facility] (supporting Jacky Kindle, FNP)14 hours ago (6:11 PM)    Sorry,  it was Sertraline,  Losartan and Atorvastatin. (not Metoprolol)    Kristin [name] Re-Herriott  P [facility] (supporting Jacky Kindle, FNP)15 hours ago (6:03 PM)    Hello,   I had a visit today to get refills for my meds.  My refills for Sertraline, Metoprolol and Atorvastatin [facility] of [facility] ([facility] month supply).  I declined to get these from [facility].  I need these refills sent to [facility] please.   Also, I noticed that the Atorvastatin was for 80 mg. but my dose is 40 mg.  Please advise and thank you.   Kristin [name]

## 2022-11-04 ENCOUNTER — Telehealth: Payer: Self-pay | Admitting: Family Medicine

## 2022-11-04 ENCOUNTER — Other Ambulatory Visit: Payer: Self-pay | Admitting: Family Medicine

## 2022-11-04 ENCOUNTER — Other Ambulatory Visit: Payer: Self-pay | Admitting: *Deleted

## 2022-11-04 DIAGNOSIS — F3341 Major depressive disorder, recurrent, in partial remission: Secondary | ICD-10-CM

## 2022-11-04 DIAGNOSIS — I152 Hypertension secondary to endocrine disorders: Secondary | ICD-10-CM

## 2022-11-04 MED ORDER — ATORVASTATIN CALCIUM 40 MG PO TABS
40.0000 mg | ORAL_TABLET | Freq: Every day | ORAL | 3 refills | Status: DC
Start: 2022-11-04 — End: 2023-08-22

## 2022-11-04 MED ORDER — LOSARTAN POTASSIUM 50 MG PO TABS
50.0000 mg | ORAL_TABLET | Freq: Every day | ORAL | 3 refills | Status: DC
Start: 2022-11-04 — End: 2023-07-25

## 2022-11-04 MED ORDER — SERTRALINE HCL 100 MG PO TABS: 100.0000 mg | ORAL_TABLET | Freq: Every day | ORAL | 3 refills | Status: DC

## 2022-11-04 MED ORDER — METOPROLOL SUCCINATE ER 25 MG PO TB24
25.0000 mg | ORAL_TABLET | Freq: Every day | ORAL | 3 refills | Status: DC
Start: 2022-11-04 — End: 2023-10-27

## 2022-11-04 NOTE — Telephone Encounter (Signed)
Spoke to pt--stated cancel all the medication that was sent [facility] and request to re-sent medication to [facility]. Medication sent to the [facility] pharmacy and routed the zoloft to Beec[place], NP.

## 2022-11-04 NOTE — Telephone Encounter (Signed)
Pls see MyChart message. Pt had a request for meds and they were [facility] to [facility] mail order. She has sent this message 6/6 with no response. The only med she picked [facility] from [facility] was Alprazolam. The rest need to be resent   [employer]) [place], [place] - [address] [place] Phone: [phone]  Fax: [phone]      Also atorvastatin (LIPITOR) 80 MG tablet  Pt is stating an error in script, she states that she was taking 40 mg before to of Liptor and her #'s were very good and she does not think the increase to be warranted.   To recap meds on 6/6 were sent to incorrect pharmacy, s/b Mail order and MyChart reflects pt message of incorrect pharmacy. Pt request a fu call to make sure message is received as she will be out of meds having to get mail order so wants response at below:   [phone]

## 2023-02-16 ENCOUNTER — Encounter: Payer: Self-pay | Admitting: Family Medicine

## 2023-02-16 ENCOUNTER — Ambulatory Visit (INDEPENDENT_AMBULATORY_CARE_PROVIDER_SITE_OTHER): Payer: Managed Care, Other (non HMO) | Admitting: Family Medicine

## 2023-02-16 VITALS — BP 105/67 | HR 69 | Ht 68.0 in | Wt 166.9 lb

## 2023-02-16 DIAGNOSIS — Z23 Encounter for immunization: Secondary | ICD-10-CM

## 2023-02-16 DIAGNOSIS — E1069 Type 1 diabetes mellitus with other specified complication: Secondary | ICD-10-CM

## 2023-02-16 DIAGNOSIS — Z794 Long term (current) use of insulin: Secondary | ICD-10-CM

## 2023-02-16 DIAGNOSIS — E1159 Type 2 diabetes mellitus with other circulatory complications: Secondary | ICD-10-CM

## 2023-02-16 DIAGNOSIS — E785 Hyperlipidemia, unspecified: Secondary | ICD-10-CM

## 2023-02-16 DIAGNOSIS — Z0001 Encounter for general adult medical examination with abnormal findings: Secondary | ICD-10-CM | POA: Diagnosis not present

## 2023-02-16 DIAGNOSIS — F39 Unspecified mood [affective] disorder: Secondary | ICD-10-CM | POA: Diagnosis not present

## 2023-02-16 DIAGNOSIS — I152 Hypertension secondary to endocrine disorders: Secondary | ICD-10-CM

## 2023-02-16 DIAGNOSIS — Z0000 Encounter for general adult medical examination without abnormal findings: Secondary | ICD-10-CM

## 2023-02-16 NOTE — Assessment & Plan Note (Signed)
UTD on dental, vision, no hearing or skin concerns Things to do to keep yourself healthy  - Exercise at least 30-45 minutes a day, 3-4 days a week.  - Eat a low-fat diet with lots of fruits and vegetables, up to 7-9 servings per day.  - Seatbelts can save your life. Wear them always.  - Smoke detectors on every level of your home, check batteries every year.  - Eye Doctor - have an eye exam every 1-2 years  - Safe sex - if you may be exposed to STDs, use a condom.  - Alcohol -  If you drink, do it moderately, less than 2 drinks per day.  - Health Care Power of Attorney. Choose someone to speak for you if you are not able.  - Depression is common in our stressful world.If you're feeling down or losing interest in things you normally enjoy, please come in for a visit.  - Violence - If anyone is threatening or hurting you, please call immediately.

## 2023-02-16 NOTE — Progress Notes (Signed)
Complete physical exam  Patient: Kristin [name]   DOB: 1960-12-14   62 y.o. Female  MRN: [id number] Visit Date: 02/16/2023  Today's healthcare provider: Jacky Kindle, FNP  Re-introduced to nurse practitioner role and practice setting.  All questions answered.  Discussed provider/patient relationship and expectations.  Chief Complaint  Patient presents with   Annual Exam    Diet- General diet but tracking with weight watchers Exercise - None Feeling - well Sleeping - well Concerns - None   Subjective    Kristin [name] is a 62 y.o. female who presents today for a complete physical exam.  She reports consuming a general diet. The patient does not participate in regular exercise at present. She generally feels well. She reports sleeping well. She does not have additional problems to discuss today.  HPI HPI     Annual Exam    Additional comments: Diet- General diet but tracking with weight watchers Exercise - None Feeling - well Sleeping - well Concerns - None      Last edited by Acey Lav, CMA on 02/16/2023  8:36 AM.      Past Medical History:  Diagnosis Date   Anxiety    Depression    Diabetes mellitus without complication (HCC)    type 1   Elevated cholesterol    Hypertension    Hypothyroidism    Past Surgical History:  Procedure Laterality Date   BREAST BIOPSY Left    bx/clip-neg   CARDIAC VALVE SURGERY  1963   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   CARPAL TUNNEL RELEASE Left 11/06/2015   Procedure: CARPAL TUNNEL RELEASE;  Surgeon: Kennedy Bucker, MD;  Location: [facility] ORS;  Service: Orthopedics;  Laterality: Left;   COLONOSCOPY WITH PROPOFOL N/A 07/14/2017   Procedure: COLONOSCOPY WITH PROPOFOL;  Surgeon: Toney Reil, MD;  Location: [facility] ENDOSCOPY;  Service: Gastroenterology;  Laterality: N/A;   COLONOSCOPY WITH PROPOFOL N/A 07/23/2022   Procedure: COLONOSCOPY WITH PROPOFOL;  Surgeon: Toney Reil, MD;  Location: [facility] ENDOSCOPY;   Service: Gastroenterology;  Laterality: N/A;   ctr Right    HAND SURGERY Right 2010   carpal tunnel   THYROID LOBECTOMY Left 03/2000   Social History   Socioeconomic History   Marital status: Widowed    Spouse name: Not on file   Number of children: 0   Years of education: College   Highest education level: Not on file  Occupational History   Not on file  Tobacco Use   Smoking status: Former    Current packs/day: 0.00    Average packs/day: 1 pack/day for 33.0 years (33.0 ttl pk-yrs)    Types: Cigarettes    Start date: 05/25/1975    Quit date: 05/24/2008    Years since quitting: 14.7   Smokeless tobacco: Never  Vaping Use   Vaping status: Never Used  Substance and Sexual Activity   Alcohol use: Yes    Comment: Occasional alcohol use   Drug use: No   Sexual activity: Not on file  Other Topics Concern   Not on file  Social History Narrative   Not on file   Social Determinants of Health   Financial Resource Strain: Not on file  Food Insecurity: Not on file  Transportation Needs: Not on file  Physical Activity: Not on file  Stress: Not on file  Social Connections: Not on file  Intimate Partner Violence: Not on file   Family Status  Relation Name Status   Mother  Alive   Sister  Alive   Brother  Deceased       MVA   MGM  Deceased   MGF  Deceased   Brother  Alive   Father Unknown (Not Specified)   Neg Hx  (Not Specified)  No partnership data on file   Family History  Problem Relation Age of Onset   Alcohol abuse Mother    Anxiety disorder Mother    Hypothyroidism Mother    Alcohol abuse Sister    Anxiety disorder Sister    Bipolar disorder Sister    Alcohol abuse Brother    Depression Maternal Grandmother    Stroke Maternal Grandmother    Alcohol abuse Maternal Grandfather    Heart disease Maternal Grandfather    Healthy Brother    Breast cancer Neg Hx    Allergies  Allergen Reactions   Asacol  [Mesalamine]     Other reaction(s): Abdominal pain    Lisinopril Cough    Patient Care Team: Jacky Kindle, FNP as PCP - General (Family Medicine) Irene Limbo., MD (Ophthalmology)   Medications: Outpatient Medications Prior to Visit  Medication Sig   ALPRAZolam (XANAX) 0.5 MG tablet Take 1 tablet (0.5 mg total) by mouth as needed for anxiety (as needed).   aspirin 81 MG tablet Take 81 mg by mouth daily.   atorvastatin (LIPITOR) 40 MG tablet Take 1 tablet (40 mg total) by mouth daily.   budesonide (ENTOCORT EC) 3 MG 24 hr capsule Take 1 capsule (3 mg total) by mouth daily.   Cholecalciferol (VITAMIN D3) 2000 units TABS Take 2 capsules by mouth daily.   Continuous Blood Gluc Sensor (DEXCOM G6 SENSOR) MISC Change every 10 days   Continuous Blood Gluc Transmit (DEXCOM G6 TRANSMITTER) MISC Change every 90 days   HUMALOG KWIKPEN 100 UNIT/ML KwikPen Inject into the skin.   insulin glargine (LANTUS) 100 UNIT/ML Solostar Pen Inject 22 Units into the skin daily at 10 pm.   levothyroxine (SYNTHROID) 137 MCG tablet Take 137 mcg by mouth daily before breakfast.   losartan (COZAAR) 50 MG tablet Take 1 tablet (50 mg total) by mouth daily.   metoprolol succinate (TOPROL-XL) 25 MG 24 hr tablet Take 1 tablet (25 mg total) by mouth daily.   OZEMPIC, 1 MG/DOSE, 4 MG/3ML SOPN Inject into the skin.   sertraline (ZOLOFT) 100 MG tablet Take 1 tablet (100 mg total) by mouth daily.   [DISCONTINUED] levothyroxine (SYNTHROID) 150 MCG tablet Take 137 mcg by mouth daily. (Patient not taking: Reported on 02/16/2023)   No facility-administered medications prior to visit.    Review of Systems    Objective    BP 105/67 (BP Location: Left Arm, Patient Position: Sitting, Cuff Size: Normal)   Pulse 69   Ht 5\' 8"  (1.727 m)   Wt 166 lb 14.4 oz (75.7 kg)   BMI 25.38 kg/m    Physical Exam Vitals and nursing note reviewed.  Constitutional:      General: She is awake. She is not in acute distress.    Appearance: Normal appearance. She is well-developed,  well-groomed and normal weight. She is not ill-appearing, toxic-appearing or diaphoretic.  HENT:     Head: Normocephalic and atraumatic.     Jaw: There is normal jaw occlusion. No trismus, tenderness, swelling or pain on movement.     Right Ear: Hearing, tympanic membrane, ear canal and external ear normal. There is no impacted cerumen.     Left Ear: Hearing, tympanic membrane, ear canal  and external ear normal. There is no impacted cerumen.     Nose: Nose normal. No congestion or rhinorrhea.     Right Turbinates: Not enlarged, swollen or pale.     Left Turbinates: Not enlarged, swollen or pale.     Right Sinus: No maxillary sinus tenderness or frontal sinus tenderness.     Left Sinus: No maxillary sinus tenderness or frontal sinus tenderness.     Mouth/Throat:     Lips: Pink.     Mouth: Mucous membranes are moist. No injury.     Tongue: No lesions.     Pharynx: Oropharynx is clear. Uvula midline. No pharyngeal swelling, oropharyngeal exudate, posterior oropharyngeal erythema or uvula swelling.     Tonsils: No tonsillar exudate or tonsillar abscesses.  Eyes:     General: Lids are normal. Lids are everted, no foreign bodies appreciated. Vision grossly intact. Gaze aligned appropriately. No allergic shiner or visual field deficit.       Right eye: No discharge.        Left eye: No discharge.     Extraocular Movements: Extraocular movements intact.     Conjunctiva/sclera: Conjunctivae normal.     Right eye: Right conjunctiva is not injected. No exudate.    Left eye: Left conjunctiva is not injected. No exudate.    Pupils: Pupils are equal, round, and reactive to light.  Neck:     Thyroid: No thyroid mass, thyromegaly or thyroid tenderness.     Vascular: No carotid bruit.     Trachea: Trachea normal.  Cardiovascular:     Rate and Rhythm: Normal rate and regular rhythm.     Pulses: Normal pulses.          Carotid pulses are 2+ on the right side and 2+ on the left side.      Radial pulses  are 2+ on the right side and 2+ on the left side.       Dorsalis pedis pulses are 2+ on the right side and 2+ on the left side.       Posterior tibial pulses are 2+ on the right side and 2+ on the left side.     Heart sounds: Normal heart sounds, S1 normal and S2 normal. No murmur heard.    No friction rub. No gallop.  Pulmonary:     Effort: Pulmonary effort is normal. No respiratory distress.     Breath sounds: Normal breath sounds and air entry. No stridor. No wheezing, rhonchi or rales.  Chest:     Chest wall: No tenderness.  Abdominal:     General: Abdomen is flat. Bowel sounds are normal. There is no distension.     Palpations: Abdomen is soft. There is no mass.     Tenderness: There is no abdominal tenderness. There is no right CVA tenderness, left CVA tenderness, guarding or rebound.     Hernia: No hernia is present.  Genitourinary:    Comments: Exam deferred; denies complaints Musculoskeletal:        General: No swelling, tenderness, deformity or signs of injury. Normal range of motion.     Cervical back: Full passive range of motion without pain, normal range of motion and neck supple. No edema, rigidity or tenderness. No muscular tenderness.     Right lower leg: No edema.     Left lower leg: No edema.  Lymphadenopathy:     Cervical: No cervical adenopathy.     Right cervical: No superficial, deep or posterior cervical adenopathy.  Left cervical: No superficial, deep or posterior cervical adenopathy.  Skin:    General: Skin is warm and dry.     Capillary Refill: Capillary refill takes less than 2 seconds.     Coloration: Skin is not jaundiced or pale.     Findings: No bruising, erythema, lesion or rash.  Neurological:     General: No focal deficit present.     Mental Status: She is alert and oriented to person, place, and time. Mental status is at baseline.     GCS: GCS eye subscore is 4. GCS verbal subscore is 5. GCS motor subscore is 6.     Sensory: Sensation is  intact. No sensory deficit.     Motor: Motor function is intact. No weakness.     Coordination: Coordination is intact. Coordination normal.     Gait: Gait is intact. Gait normal.  Psychiatric:        Attention and Perception: Attention and perception normal.        Mood and Affect: Mood and affect normal.        Speech: Speech normal.        Behavior: Behavior normal. Behavior is cooperative.        Thought Content: Thought content normal.        Cognition and Memory: Cognition and memory normal.        Judgment: Judgment normal.      Last depression screening scores    02/16/2023    8:36 AM 10/28/2022    3:01 PM 02/12/2022    9:25 AM  PHQ 2/9 Scores  PHQ - 2 Score 2 2 2   PHQ- 9 Score 3 3 2    Last fall risk screening    02/16/2023    8:36 AM  Fall Risk   Falls in the past year? 1  Number falls in past yr: 0  Injury with Fall? 0  Risk for fall due to : No Fall Risks  Follow up Falls evaluation completed   Last Audit-C alcohol use screening    10/28/2022    3:01 PM  Alcohol Use Disorder Test (AUDIT)  1. How often do you have a drink containing alcohol? 3  2. How many drinks containing alcohol do you have on a typical day when you are drinking? 0  3. How often do you have six or more drinks on one occasion? 0  AUDIT-C Score 3   A score of 3 or more in women, and 4 or more in men indicates increased risk for alcohol abuse, EXCEPT if all of the points are from question 1   No results found for any visits on 02/16/23.  Assessment & Plan    Routine Health Maintenance and Physical Exam  Exercise Activities and Dietary recommendations  Goals   None     Immunization History  Administered Date(s) Administered   H1N1 08/01/2008   Influenza Inj Mdck Quad Pf 03/24/2019   Influenza Split 03/31/2012, 04/14/2015   Influenza, High Dose Seasonal PF 05/15/2018   Influenza, Seasonal, Injecte, Preservative Fre 02/16/2023   Influenza,inj,Quad PF,6+ Mos 04/17/2013, 04/26/2014,  05/15/2018, 01/31/2020, 02/02/2021, 02/12/2022   Influenza-Unspecified 04/17/2013, 04/26/2014, 02/19/2016, 02/11/2017   Moderna Sars-Covid-2 Vaccination 08/22/2019, 09/19/2019, 04/01/2020, 09/05/2020   Pneumococcal Polysaccharide-23 04/17/2013   Td 02/02/2021   Tdap 01/14/2011   Zoster Recombinant(Shingrix) 11/13/2018, 01/22/2019   Zoster, Live 05/15/2013    Health Maintenance  Topic Date Due   Lung Cancer Screening  10/19/2008   OPHTHALMOLOGY EXAM  02/05/2022  COVID-19 Vaccine (5 - 2023-24 season) 01/23/2023   FOOT EXAM  02/16/2024 (Originally 02/02/2022)   HEMOGLOBIN A1C  04/23/2023   Diabetic kidney evaluation - eGFR measurement  10/21/2023   Diabetic kidney evaluation - Urine ACR  10/21/2023   Cervical Cancer Screening (HPV/Pap Cotest)  11/13/2023   MAMMOGRAM  06/28/2024   Colonoscopy  07/22/2025   DTaP/Tdap/Td (3 - Td or Tdap) 02/03/2031   INFLUENZA VACCINE  Completed   Hepatitis C Screening  Completed   HIV Screening  Completed   Zoster Vaccines- Shingrix  Completed   HPV VACCINES  Aged Out    Discussed health benefits of physical activity, and encouraged her to engage in regular exercise appropriate for her age and condition.  Problem List Items Addressed This Visit       Cardiovascular and Mediastinum   Hypertension associated with diabetes (HCC)     Endocrine   Hyperlipidemia due to type 1 diabetes mellitus (HCC)    Chronic, at goal Last Rx was for Lipitor 40 mg I continue to recommend diet low in saturated fat and regular exercise - 30 min at least 5 times per week         Other   Annual physical exam - Primary    UTD on dental, vision, no hearing or skin concerns Things to do to keep yourself healthy  - Exercise at least 30-45 minutes a day, 3-4 days a week.  - Eat a low-fat diet with lots of fruits and vegetables, up to 7-9 servings per day.  - Seatbelts can save your life. Wear them always.  - Smoke detectors on every level of your home, check  batteries every year.  - Eye Doctor - have an eye exam every 1-2 years  - Safe sex - if you may be exposed to STDs, use a condom.  - Alcohol -  If you drink, do it moderately, less than 2 drinks per day.  - Health Care Power of Attorney. Choose someone to speak for you if you are not able.  - Depression is common in our stressful world.If you're feeling down or losing interest in things you normally enjoy, please come in for a visit.  - Violence - If anyone is threatening or hurting you, please call immediately.       Mood disorder (HCC)    Chronic, stable Declines dose adjustment in zoloft or xanax to assist with MDD, current, and panic attacks Denies self harm or harm of others; continue to monitor       Other Visit Diagnoses     Immunization due       Relevant Orders   Flu vaccine trivalent PF, 6mos and older(Flulaval,Afluria,Fluarix,Fluzone) (Completed)   Influenza vaccine needed       Relevant Orders   Flu vaccine trivalent PF, 6mos and older(Flulaval,Afluria,Fluarix,Fluzone) (Completed)      Return in about 6 months (around 08/16/2023) for chonic disease management.    Leilani Merl, FNP, have reviewed all documentation for this visit. The documentation on 02/16/23 for the exam, diagnosis, procedures, and orders are all accurate and complete.  Jacky Kindle, FNP  [facility] [phone] (phone) [phone] (fax)  [facility]

## 2023-02-16 NOTE — Assessment & Plan Note (Signed)
Chronic, stable Declines dose adjustment in zoloft or xanax to assist with MDD, current, and panic attacks Denies self harm or harm of others; continue to monitor

## 2023-02-16 NOTE — Assessment & Plan Note (Signed)
Chronic, at goal Last Rx was for Lipitor 40 mg I continue to recommend diet low in saturated fat and regular exercise - 30 min at least 5 times per week

## 2023-02-16 NOTE — Patient Instructions (Signed)
Dear Ms. Re-Herriott,  You have been referred to the Lung Cancer Screening program.  Our program will assist you with completing an annual Low Dose CT of the chest.  This is a recognized screening tool for detection of early lung cancer and is offered to people who have a smoking history.    If you are interested in scheduling this CT or would like further discussion on our program, please contact us at [phone].  Thank you,   Lung Cancer Screening Team

## 2023-04-01 ENCOUNTER — Other Ambulatory Visit: Payer: Self-pay | Admitting: Emergency Medicine

## 2023-04-01 DIAGNOSIS — Z87891 Personal history of nicotine dependence: Secondary | ICD-10-CM

## 2023-04-01 DIAGNOSIS — Z122 Encounter for screening for malignant neoplasm of respiratory organs: Secondary | ICD-10-CM

## 2023-04-06 ENCOUNTER — Encounter: Payer: Self-pay | Admitting: Adult Health

## 2023-04-06 ENCOUNTER — Inpatient Hospital Stay
Admission: RE | Admit: 2023-04-06 | Discharge: 2023-04-06 | Disposition: A | Discharge status: Home / Self Care | Payer: Managed Care, Other (non HMO) | Source: Ambulatory Visit | Attending: Acute Care | Admitting: Acute Care

## 2023-04-06 ENCOUNTER — Ambulatory Visit (INDEPENDENT_AMBULATORY_CARE_PROVIDER_SITE_OTHER): Payer: Managed Care, Other (non HMO) | Admitting: Adult Health

## 2023-04-06 DIAGNOSIS — Z122 Encounter for screening for malignant neoplasm of respiratory organs: Secondary | ICD-10-CM

## 2023-04-06 DIAGNOSIS — Z87891 Personal history of nicotine dependence: Secondary | ICD-10-CM

## 2023-04-06 NOTE — Progress Notes (Signed)
  Virtual Visit via Telephone Note  I connected with Kristin [name] , 04/06/23 9:25 AM by a telemedicine application and verified that I am speaking with the correct person using two identifiers.  Location: Patient: home Provider: home   I discussed the limitations of evaluation and management by telemedicine and the availability of in person appointments. The patient expressed understanding and agreed to proceed.   Shared Decision Making Visit Lung Cancer Screening Program [phone])   Eligibility: 62 y.o. Pack Years Smoking History Calculation = 34 pack years  (# packs/per year x # years smoked) Recent History of coughing up blood  no Unexplained weight loss? no ( >Than 15 pounds within the last 6 months ) Prior History Lung / other cancer no (Diagnosis within the last 5 years already requiring surveillance chest CT Scans). Smoking Status Former Smoker Former Smokers: Years since quit: 4 years  Quit Date: 2010  Visit Components: Discussion included one or more decision making aids. YES Discussion included risk/benefits of screening. YES Discussion included potential follow up diagnostic testing for abnormal scans. YES Discussion included meaning and risk of over diagnosis. YES Discussion included meaning and risk of False Positives. YES Discussion included meaning of total radiation exposure. YES  Counseling Included: Importance of adherence to annual lung cancer LDCT screening. YES Impact of comorbidities on ability to participate in the program. YES Ability and willingness to under diagnostic treatment. YES  Smoking Cessation Counseling: Former Smokers:  Discussed the importance of maintaining cigarette abstinence. yes Diagnosis Code: Personal History of Nicotine Dependence. A21.308 Information about tobacco cessation classes and interventions provided to patient. Yes Patient provided with "ticket" for LDCT Scan. yes Written Order for Lung Cancer Screening with LDCT  placed in Epic. Yes (CT Chest Lung Cancer Screening Low Dose W/O CM) [id number]  Z12.2-Screening of respiratory organs Z87.891-Personal history of nicotine dependence   Danford Bad 04/06/23

## 2023-04-06 NOTE — Patient Instructions (Signed)

## 2023-04-28 ENCOUNTER — Other Ambulatory Visit: Payer: Self-pay | Admitting: Acute Care

## 2023-04-28 DIAGNOSIS — Z122 Encounter for screening for malignant neoplasm of respiratory organs: Secondary | ICD-10-CM

## 2023-04-28 DIAGNOSIS — Z87891 Personal history of nicotine dependence: Secondary | ICD-10-CM

## 2023-04-29 ENCOUNTER — Encounter: Payer: Self-pay | Admitting: Family Medicine

## 2023-04-29 NOTE — Telephone Encounter (Signed)
 Care team updated and letter sent for eye exam notes.

## 2023-07-25 ENCOUNTER — Other Ambulatory Visit: Payer: Self-pay

## 2023-07-25 ENCOUNTER — Telehealth: Payer: Self-pay | Admitting: Family Medicine

## 2023-07-25 DIAGNOSIS — E1159 Type 2 diabetes mellitus with other circulatory complications: Secondary | ICD-10-CM

## 2023-07-25 DIAGNOSIS — F3341 Major depressive disorder, recurrent, in partial remission: Secondary | ICD-10-CM

## 2023-07-25 LAB — BASIC METABOLIC PANEL WITH GFR
CO2: 30 — AB (ref 13–22)
Chloride: 101 (ref 99–108)
Glucose: 169
Potassium: 4.1 meq/L (ref 3.5–5.1)
Sodium: 138 (ref 137–147)

## 2023-07-25 LAB — COMPREHENSIVE METABOLIC PANEL WITH GFR: Calcium: 9.4 (ref 8.7–10.7)

## 2023-07-25 LAB — HEMOGLOBIN A1C: Hemoglobin A1C: 6

## 2023-07-25 LAB — TSH: TSH: 1.89 (ref 0.41–5.90)

## 2023-07-25 MED ORDER — SERTRALINE HCL 100 MG PO TABS
100.0000 mg | ORAL_TABLET | Freq: Every day | ORAL | 0 refills | Status: DC
Start: 2023-07-25 — End: 2023-08-22

## 2023-07-25 MED ORDER — LOSARTAN POTASSIUM 50 MG PO TABS
50.0000 mg | ORAL_TABLET | Freq: Every day | ORAL | 0 refills | Status: DC
Start: 2023-07-25 — End: 2023-08-29

## 2023-07-25 NOTE — Telephone Encounter (Signed)
 [facility] faxed refill request for the following medications:   losartan (COZAAR) 50 MG tablet     sertraline (ZOLOFT) 100 MG tablet     Please advise.

## 2023-07-26 ENCOUNTER — Other Ambulatory Visit: Payer: Self-pay | Admitting: Family Medicine

## 2023-07-26 DIAGNOSIS — E1159 Type 2 diabetes mellitus with other circulatory complications: Secondary | ICD-10-CM

## 2023-07-26 DIAGNOSIS — I152 Hypertension secondary to endocrine disorders: Secondary | ICD-10-CM

## 2023-07-26 DIAGNOSIS — F3341 Major depressive disorder, recurrent, in partial remission: Secondary | ICD-10-CM

## 2023-07-27 ENCOUNTER — Telehealth: Payer: Self-pay | Admitting: Family Medicine

## 2023-07-27 NOTE — Telephone Encounter (Signed)
 [facility] is requesting refill losartan (COZAAR) 50 MG tablet  & sertraline (ZOLOFT) 100 MG tablet  Please advise

## 2023-08-03 ENCOUNTER — Other Ambulatory Visit: Payer: Self-pay

## 2023-08-03 ENCOUNTER — Emergency Department

## 2023-08-03 ENCOUNTER — Emergency Department
Admission: EM | Admit: 2023-08-03 | Discharge: 2023-08-04 | Disposition: A | Discharge status: Home / Self Care | Attending: Emergency Medicine | Admitting: Emergency Medicine

## 2023-08-03 DIAGNOSIS — K5900 Constipation, unspecified: Secondary | ICD-10-CM | POA: Insufficient documentation

## 2023-08-03 DIAGNOSIS — R109 Unspecified abdominal pain: Secondary | ICD-10-CM | POA: Diagnosis present

## 2023-08-03 LAB — COMPREHENSIVE METABOLIC PANEL
ALT: 32 U/L (ref 0–44)
AST: 25 U/L (ref 15–41)
Albumin: 4.2 g/dL (ref 3.5–5.0)
Alkaline Phosphatase: 46 U/L (ref 38–126)
Anion gap: 10 (ref 5–15)
BUN: 17 mg/dL (ref 8–23)
CO2: 27 mmol/L (ref 22–32)
Calcium: 9.4 mg/dL (ref 8.9–10.3)
Chloride: 99 mmol/L (ref 98–111)
Creatinine, Ser: 0.79 mg/dL (ref 0.44–1.00)
GFR, Estimated: >60 mL/min (ref >60)
Glucose, Bld: 140 mg/dL — ABNORMAL HIGH (ref 70–99)
Potassium: 4.1 mmol/L (ref 3.5–5.1)
Sodium: 136 mmol/L (ref 135–145)
Total Bilirubin: 1.3 mg/dL — ABNORMAL HIGH (ref 0.0–1.2)
Total Protein: 7.2 g/dL (ref 6.5–8.1)

## 2023-08-03 LAB — CBC
HCT: 45.3 % (ref 36.0–46.0)
Hemoglobin: 15 g/dL (ref 12.0–15.0)
MCH: 32.7 pg (ref 26.0–34.0)
MCHC: 33.1 g/dL (ref 30.0–36.0)
MCV: 98.7 fL (ref 80.0–100.0)
Platelets: 218 10*3/uL (ref 150–400)
RBC: 4.59 MIL/uL (ref 3.87–5.11)
RDW: 12.6 % (ref 11.5–15.5)
WBC: 5.5 10*3/uL (ref 4.0–10.5)
nRBC: 0 % (ref 0.0–0.2)

## 2023-08-03 LAB — LIPASE, BLOOD: Lipase: 39 U/L (ref 11–51)

## 2023-08-03 MED ORDER — IOHEXOL 350 MG/ML SOLN
75.0000 mL | Freq: Once | INTRAVENOUS | Status: AC | PRN
  Administered 2023-08-03: 75 mL via INTRAVENOUS

## 2023-08-03 NOTE — ED Triage Notes (Signed)
 Pt sent for [facility] with c/o of RLQ pain that has been ongoing since Thursday. Pt states sent over to rule out appendicitis and gallbladder issues. Pt denies dysuria. Pt denies fevers or chills.

## 2023-08-04 LAB — OPHTHALMOLOGY REPORT-SCANNED

## 2023-08-04 NOTE — Discharge Instructions (Signed)
 I would like for you to pick up MiraLAX over-the-counter and take 1-2 capfuls in the morning each day until you are having soft bowel movements 1-2 times per day.  Hopefully this should help with symptoms.  Otherwise, follow-up with your primary care provider in the next couple weeks for reassessment and potential alterations to this regimen.

## 2023-08-04 NOTE — ED Provider Notes (Signed)
 [facility] Provider Note    Event Date/Time   First MD Initiated Contact with Patient 08/03/23 2346     (approximate)   History   Abdominal Pain   HPI Kristin [name] is a 63 y.o. female presenting today for abdominal pain.  Patient states for the past week she has had intermittent episodes of sharp pain along the right side of her abdomen.  This can last anywhere between 30 and 60 seconds and then she feels fine the rest of the day.  Otherwise denies nausea, vomiting, chest pain, fever, shortness of breath, diarrhea, constipation, dysuria, hematuria.  No prior abdominal surgeries.  Has not taken any medication for the symptoms.  Currently she is asymptomatic.     Physical Exam   Triage Vital Signs: ED Triage Vitals  Encounter Vitals Group     BP 08/03/23 1817 (!) 142/81     Systolic BP Percentile --      Diastolic BP Percentile --      Pulse Rate 08/03/23 1813 81     Resp 08/03/23 1813 17     Temp 08/03/23 1813 98.8 F (37.1 C)     Temp Source 08/03/23 1813 Oral     SpO2 08/03/23 1813 97 %     Weight 08/03/23 1814 160 lb (72.6 kg)     Height 08/03/23 1814 5\' 8"  (1.727 m)     Head Circumference --      Peak Flow --      Pain Score 08/03/23 1814 1     Pain Loc --      Pain Education --      Exclude from Growth Chart --     Most recent vital signs: Vitals:   08/03/23 1813 08/03/23 1817  BP:  (!) 142/81  Pulse: 81   Resp: 17   Temp: 98.8 F (37.1 C)   SpO2: 97%    Physical Exam: I have reviewed the vital signs and nursing notes. General: Awake, alert, no acute distress.  Nontoxic appearing. Head:  Atraumatic, normocephalic.   ENT:  EOM intact, PERRL. Oral mucosa is pink and moist with no lesions. Neck: Neck is supple with full range of motion, No meningeal signs. Cardiovascular:  RRR, No murmurs. Peripheral pulses palpable and equal bilaterally. Respiratory:  Symmetrical chest wall expansion.  No rhonchi, rales, or wheezes.  Good  air movement throughout.  No use of accessory muscles.   Musculoskeletal:  No cyanosis or edema. Moving extremities with full ROM Abdomen:  Soft, nontender, nondistended. Neuro:  GCS 15, moving all four extremities, interacting appropriately. Speech clear. Psych:  Calm, appropriate.   Skin:  Warm, dry, no rash.    ED Results / Procedures / Treatments   Labs (all labs ordered are listed, but only abnormal results are displayed) Labs Reviewed  COMPREHENSIVE METABOLIC PANEL - Abnormal; Notable for the following components:      Result Value   Glucose, Bld 140 (*)    Total Bilirubin 1.3 (*)    All other components within normal limits  LIPASE, BLOOD  CBC  URINALYSIS, ROUTINE W REFLEX MICROSCOPIC     EKG    RADIOLOGY Independently interpreted CT abdomen/pelvis with no acute pathology but evidence of moderate stool burden   PROCEDURES:  Critical Care performed: No  Procedures   MEDICATIONS ORDERED IN ED: Medications  iohexol (OMNIPAQUE) 350 MG/ML injection 75 mL (75 mLs Intravenous Contrast Given 08/03/23 2008)     IMPRESSION / MDM / ASSESSMENT AND PLAN /  ED COURSE  I reviewed the triage vital signs and the nursing notes.                              Differential diagnosis includes, but is not limited to, cholecystitis, appendicitis, nephrolithiasis, pyelonephritis, constipation, colitis  Patient's presentation is most consistent with acute complicated illness / injury requiring diagnostic workup.  Patient is a 63 year old female presenting today for intermittent episodes lasting 30 to 60 seconds of sharp right-sided abdominal pain.  Currently asymptomatic here and no other other associated symptoms.  Physical exam unremarkable and vital signs stable.  Laboratory workup largely reassuring other than mild T. bili elevation.  CT abdomen/pelvis shows no acute pathology but she does have moderate amount of stool burden.  Given the very brief episode of symptoms I suspect it  is more likely bowel spasms given her constipation.  Will start her on a MiraLAX bowel regimen course and have her follow-up with her primary care provider.  She was given strict return precautions and agreeable with plan.     FINAL CLINICAL IMPRESSION(S) / ED DIAGNOSES   Final diagnoses:  Constipation, unspecified constipation type     Rx / DC Orders   ED Discharge Orders     None        Note:  This document was prepared using Dragon voice recognition software and may include unintentional dictation errors.   Janith Lima, MD 08/04/23 [phone]

## 2023-08-12 ENCOUNTER — Telehealth: Payer: Self-pay | Admitting: Family Medicine

## 2023-08-12 DIAGNOSIS — I152 Hypertension secondary to endocrine disorders: Secondary | ICD-10-CM

## 2023-08-12 NOTE — Telephone Encounter (Signed)
 [facility] is requesting refill losartan (COZAAR) 50 MG tablet  Please advise

## 2023-08-16 ENCOUNTER — Ambulatory Visit: Payer: Managed Care, Other (non HMO) | Admitting: Family Medicine

## 2023-08-22 ENCOUNTER — Ambulatory Visit: Payer: Self-pay | Admitting: Family Medicine

## 2023-08-22 VITALS — BP 128/76 | HR 57 | Ht 68.0 in | Wt 165.2 lb

## 2023-08-22 DIAGNOSIS — E1069 Type 1 diabetes mellitus with other specified complication: Secondary | ICD-10-CM

## 2023-08-22 DIAGNOSIS — E785 Hyperlipidemia, unspecified: Secondary | ICD-10-CM

## 2023-08-22 DIAGNOSIS — R109 Unspecified abdominal pain: Secondary | ICD-10-CM

## 2023-08-22 DIAGNOSIS — F410 Panic disorder [episodic paroxysmal anxiety] without agoraphobia: Secondary | ICD-10-CM | POA: Diagnosis not present

## 2023-08-22 DIAGNOSIS — Z23 Encounter for immunization: Secondary | ICD-10-CM

## 2023-08-22 DIAGNOSIS — R1031 Right lower quadrant pain: Secondary | ICD-10-CM

## 2023-08-22 DIAGNOSIS — F3341 Major depressive disorder, recurrent, in partial remission: Secondary | ICD-10-CM | POA: Diagnosis not present

## 2023-08-22 MED ORDER — SERTRALINE HCL 100 MG PO TABS: 100.0000 mg | ORAL_TABLET | Freq: Every day | ORAL | 3 refills | Status: DC

## 2023-08-22 MED ORDER — ATORVASTATIN CALCIUM 40 MG PO TABS: 40.0000 mg | ORAL_TABLET | Freq: Every day | ORAL | 3 refills | Status: DC

## 2023-08-22 MED ORDER — DICYCLOMINE HCL 10 MG PO CAPS: 10.0000 mg | ORAL_CAPSULE | Freq: Three times a day (TID) | ORAL | 0 refills | Status: DC | PRN

## 2023-08-22 MED ORDER — ALPRAZOLAM 0.5 MG PO TABS: 0.5000 mg | ORAL_TABLET | ORAL | 5 refills | Status: DC | PRN

## 2023-08-22 NOTE — Patient Instructions (Addendum)
 Take metamucil or generic equivalent - start with one serving daily, then may increase to up to three times daily.  For next fall/winter: get Flu, Covid, and RSV vaccines.

## 2023-08-22 NOTE — Progress Notes (Signed)
 Established patient visit   Patient: Kristin [name]   DOB: 1961-01-27   63 y.o. Female  MRN: [id number] Visit Date: 08/22/2023  Today's healthcare provider: Carlean Charter, DO   Chief Complaint  Patient presents with   Follow-up    6 month follow up   Subjective    HPI Last annual exam: 02/16/2023  Kristin [name] is a 62 year old female with type 1 diabetes who presents with lower quadrant abdominal pain.  She experiences persistent lower quadrant abdominal pain, described as sometimes spasmodic and other times sharp. The pain has not changed in location or intensity, and she has not experienced relief from Verelan. A CT scan showed a moderate stool burden. She has tried MiraLAX, which provided minimal relief, and her bowel movements are typically mushy or liquid. No nausea, fever, urinary frequency, or burning during urination. Her bilirubin was slightly elevated in recent tests.  She has a history of type 1 diabetes, initially misdiagnosed as type 2, and is currently well-controlled with an A1c of 6.0. She was previously on Ozempic, which she stopped three weeks ago due to concerns about its effects on her gastrointestinal symptoms. Her glucose levels were slightly elevated in recent blood work.  She uses alprazolam  sporadically for anxiety and restless leg syndrome, particularly since her mother's traumatic death, which has increased her anxiety levels. She has not refilled her prescription recently due to expiration.  She follows a diet that includes soups, lean cuisines, and healthy choice steamers, and she is on Weight Watchers. She incorporates spinach and collard greens occasionally but not daily. She works from home and has a limited lunch break, which influences her meal choices.     Medications: Outpatient Medications Prior to Visit  Medication Sig   aspirin 81 MG tablet Take 81 mg by mouth daily.   Cholecalciferol (VITAMIN D3) 2000 units TABS Take 2  capsules by mouth daily.   HUMALOG KWIKPEN 100 UNIT/ML KwikPen Inject into the skin.   insulin  glargine (LANTUS) 100 UNIT/ML Solostar Pen Inject 22 Units into the skin daily at 10 pm.   levothyroxine (SYNTHROID) 137 MCG tablet Take 137 mcg by mouth daily before breakfast.   losartan  (COZAAR ) 50 MG tablet Take 1 tablet (50 mg total) by mouth daily.   metoprolol  succinate (TOPROL -XL) 25 MG 24 hr tablet Take 1 tablet (25 mg total) by mouth daily.   OZEMPIC, 1 MG/DOSE, 4 MG/3ML SOPN Inject into the skin.   [DISCONTINUED] ALPRAZolam  (XANAX ) 0.5 MG tablet Take 1 tablet (0.5 mg total) by mouth as needed for anxiety (as needed).   [DISCONTINUED] atorvastatin  (LIPITOR) 40 MG tablet Take 1 tablet (40 mg total) by mouth daily.   [DISCONTINUED] sertraline  (ZOLOFT ) 100 MG tablet Take 1 tablet (100 mg total) by mouth daily.   budesonide  (ENTOCORT EC ) 3 MG 24 hr capsule Take 1 capsule (3 mg total) by mouth daily.   [DISCONTINUED] Continuous Blood Gluc Sensor (DEXCOM G6 SENSOR) MISC Change every 10 days   [DISCONTINUED] Continuous Blood Gluc Transmit (DEXCOM G6 TRANSMITTER) MISC Change every 90 days   No facility-administered medications prior to visit.    Review of Systems  Constitutional:  Negative for appetite change, chills, fatigue and fever.  Respiratory:  Negative for chest tightness and shortness of breath.   Cardiovascular:  Negative for chest pain and palpitations.  Gastrointestinal:  Positive for abdominal pain (mild RLQ). Negative for nausea and vomiting.       Loose stool at baseline  Neurological:  Negative for dizziness and weakness.        Objective    BP 128/76   Pulse (!) 57   Ht 5\' 8"  (1.727 m)   Wt 165 lb 3.2 oz (74.9 kg)   SpO2 100%   BMI 25.12 kg/m     Physical Exam Constitutional:      Appearance: Normal appearance.  HENT:     Head: Normocephalic and atraumatic.  Eyes:     General: No scleral icterus.    Extraocular Movements: Extraocular movements intact.      Conjunctiva/sclera: Conjunctivae normal.  Cardiovascular:     Rate and Rhythm: Normal rate and regular rhythm.     Pulses: Normal pulses.     Heart sounds: Normal heart sounds.  Pulmonary:     Effort: Pulmonary effort is normal. No respiratory distress.     Breath sounds: Normal breath sounds.  Abdominal:     General: Bowel sounds are normal. There is no distension.     Palpations: Abdomen is soft. There is no mass.     Tenderness: There is abdominal tenderness (mild in RLQ). There is no guarding or rebound.  Musculoskeletal:     Right lower leg: No edema.     Left lower leg: No edema.  Skin:    General: Skin is warm and dry.  Neurological:     Mental Status: She is alert and oriented to person, place, and time. Mental status is at baseline.  Psychiatric:        Mood and Affect: Mood normal.        Behavior: Behavior normal.      No results found for any visits on 08/22/23.  Assessment & Plan    Right lower quadrant abdominal pain  Abdominal spasms -     Dicyclomine  HCl; Take 1 capsule (10 mg total) by mouth 3 (three) times daily as needed for spasms.  Dispense: 30 capsule; Refill: 0  Panic attacks -     ALPRAZolam ; Take 1 tablet (0.5 mg total) by mouth as needed for anxiety (as needed).  Dispense: 30 tablet; Refill: 5  Recurrent major depressive disorder, in partial remission (HCC) -     Sertraline  HCl; Take 1 tablet (100 mg total) by mouth daily.  Dispense: 90 tablet; Refill: 3  Hyperlipidemia due to type 1 diabetes mellitus (HCC) -     Atorvastatin  Calcium ; Take 1 tablet (40 mg total) by mouth daily.  Dispense: 90 tablet; Refill: 3  Encounter for Prevnar pneumococcal vaccination -     Pneumococcal conjugate vaccine 20-valent   Lower Quadrant Abdominal Pain Intermittent lower quadrant abdominal pain with moderate stool burden on CT. Pain is spasmodic or sharp, without nausea, fever, or urinary symptoms. CT ruled out acute pathology. Differential includes fecal  impaction or colitis, though these are low likelihood given recent CT results. Pain likely related to bowel spasms. MiraLAX had limited effect. Metamucil is recommended to increase fiber intake as a more sustainable solution than increasing MiraLAX dosage. - Prescribe dicyclomine  for bowel spasms - Recommend Metamucil to increase fiber intake - Advise adequate fluid intake with Metamucil - Discuss dietary modifications including increasing fresh fruits and dark green leafy vegetables  Type 1 Diabetes Mellitus Type 1 diabetes mellitus with well-controlled glycemic levels. Last A1c was 6.0. Previously on Ozempic, which improved A1c but was stopped due to gastrointestinal symptoms. Consideration to restart Ozempic at a lower dose if symptoms resolve. - Continue current diabetes management plan - Consider restarting Ozempic at a lower  dose if gastrointestinal symptoms resolve - Patient follows with endocrinology; will defer to specialist's management.  Anxiety Anxiety exacerbated by work stress and personal history. Alprazolam  is used sporadically for anxiety and restless leg syndrome without evidence of misuse or overuse. Prescription was refilled. - Refill alprazolam  prescription  General Health Maintenance Discussion of vaccinations including COVID booster, pneumonia vaccine, and RSV vaccine. She works from home with limited public exposure. RSV vaccine is recommended closer to winter for optimal benefit. - Administer pneumonia vaccine today (Prevnar 20) - Advise COVID booster and RSV vaccine in fall or winter - Request records of recent eye exam   Return in about 6 months (around 02/21/2024), or if symptoms worsen or fail to improve, for CPE.      I discussed the assessment and treatment plan with the patient  The patient was provided an opportunity to ask questions and all were answered. The patient agreed with the plan and demonstrated an understanding of the instructions.   The patient  was advised to call back or seek an in-person evaluation if the symptoms worsen or if the condition fails to improve as anticipated.    Carlean Charter, DO  [facility] [phone] (phone) [phone] (fax)  [facility]

## 2023-08-29 ENCOUNTER — Other Ambulatory Visit: Payer: Self-pay | Admitting: Family Medicine

## 2023-08-29 DIAGNOSIS — I152 Hypertension secondary to endocrine disorders: Secondary | ICD-10-CM

## 2023-08-29 MED ORDER — LOSARTAN POTASSIUM 50 MG PO TABS: 50.0000 mg | ORAL_TABLET | Freq: Every day | ORAL | 0 refills | Status: DC

## 2023-08-29 NOTE — Progress Notes (Unsigned)
07/25/2023

## 2023-08-29 NOTE — Telephone Encounter (Signed)
 [facility] faxed refill request for the following medications:   losartan (COZAAR) 50 MG tablet    Please advise

## 2023-08-31 MED ORDER — LOSARTAN POTASSIUM 50 MG PO TABS: 50.0000 mg | ORAL_TABLET | Freq: Every day | ORAL | 0 refills | Status: DC

## 2023-08-31 NOTE — Telephone Encounter (Signed)
 Received another refill request from [facility] for this medication.  Looks like it was sent to [facility].  Please send to correct pharmacy.

## 2023-08-31 NOTE — Telephone Encounter (Signed)
Prescription resend 

## 2023-08-31 NOTE — Addendum Note (Signed)
 Addended by: Marjie Skiff on: 08/31/2023 01:07 PM   Modules accepted: Orders

## 2023-10-27 ENCOUNTER — Telehealth: Payer: Self-pay | Admitting: Family Medicine

## 2023-10-27 DIAGNOSIS — E1159 Type 2 diabetes mellitus with other circulatory complications: Secondary | ICD-10-CM

## 2023-10-27 MED ORDER — METOPROLOL SUCCINATE ER 25 MG PO TB24: 25.0000 mg | ORAL_TABLET | Freq: Every day | ORAL | 3 refills | Status: DC

## 2023-10-27 NOTE — Telephone Encounter (Signed)
[facility] faxed refill request for the following medications:   metoprolol succinate (TOPROL-XL) 25 MG 24 hr tablet  Please advise

## 2023-10-27 NOTE — Telephone Encounter (Signed)
 Medication refill has been sent to [facility].

## 2023-11-13 ENCOUNTER — Other Ambulatory Visit: Payer: Self-pay | Admitting: Family Medicine

## 2023-11-13 DIAGNOSIS — I152 Hypertension secondary to endocrine disorders: Secondary | ICD-10-CM

## 2024-02-14 ENCOUNTER — Telehealth: Payer: Self-pay | Admitting: Family Medicine

## 2024-02-14 ENCOUNTER — Other Ambulatory Visit: Payer: Self-pay

## 2024-02-14 ENCOUNTER — Other Ambulatory Visit: Payer: Self-pay | Admitting: Family Medicine

## 2024-02-14 DIAGNOSIS — F3341 Major depressive disorder, recurrent, in partial remission: Secondary | ICD-10-CM

## 2024-02-14 DIAGNOSIS — E1069 Type 1 diabetes mellitus with other specified complication: Secondary | ICD-10-CM

## 2024-02-14 DIAGNOSIS — F410 Panic disorder [episodic paroxysmal anxiety] without agoraphobia: Secondary | ICD-10-CM

## 2024-02-14 DIAGNOSIS — E1159 Type 2 diabetes mellitus with other circulatory complications: Secondary | ICD-10-CM

## 2024-02-14 MED ORDER — METOPROLOL SUCCINATE ER 25 MG PO TB24: 25.0000 mg | ORAL_TABLET | Freq: Every day | ORAL | 3 refills | Status: AC

## 2024-02-14 MED ORDER — ATORVASTATIN CALCIUM 40 MG PO TABS: 40.0000 mg | ORAL_TABLET | Freq: Every day | ORAL | 3 refills | Status: AC

## 2024-02-14 MED ORDER — LOSARTAN POTASSIUM 50 MG PO TABS: 50.0000 mg | ORAL_TABLET | Freq: Every day | ORAL | 3 refills | Status: AC

## 2024-02-14 MED ORDER — SERTRALINE HCL 100 MG PO TABS: 100.0000 mg | ORAL_TABLET | Freq: Every day | ORAL | 3 refills | Status: AC

## 2024-02-14 NOTE — Telephone Encounter (Signed)
 Converted into a refill request

## 2024-02-14 NOTE — Telephone Encounter (Signed)
 Converted into refill request.

## 2024-02-14 NOTE — Telephone Encounter (Addendum)
 [facility] faxed refill request for the following medications:  sertraline  (ZOLOFT ) 100 MG tablet   metoprolol  succinate (TOPROL -XL) 25 MG 24 hr tablet   losartan  (COZAAR ) 50 MG tablet   atorvastatin  (LIPITOR) 40 MG tablet    Please advise.

## 2024-02-14 NOTE — Telephone Encounter (Signed)
 Prescription sent to [facility] as requested.

## 2024-02-14 NOTE — Telephone Encounter (Signed)
 We received refill request from [facility], Phone: [phone] Sertraline  100MG  Tablet Metoprolol  Succinate ER 25 MG Tablet, Extended release 24 HR Losartan  50MG  Tablet

## 2024-02-16 ENCOUNTER — Telehealth: Payer: Self-pay | Admitting: Family Medicine

## 2024-02-16 NOTE — Telephone Encounter (Signed)
[facility] faxed refill request for the following medications:   ALPRAZolam (XANAX) 0.5 MG tablet   Please advise.

## 2024-02-17 ENCOUNTER — Other Ambulatory Visit: Payer: Self-pay

## 2024-02-17 DIAGNOSIS — F410 Panic disorder [episodic paroxysmal anxiety] without agoraphobia: Secondary | ICD-10-CM

## 2024-02-17 NOTE — Telephone Encounter (Signed)
 Converted

## 2024-02-17 NOTE — Telephone Encounter (Signed)
 LOV 08/22/23 NOV 02/21/24  LRF 08/22/23 30 x 5

## 2024-02-21 ENCOUNTER — Ambulatory Visit: Admitting: Family Medicine

## 2024-02-21 ENCOUNTER — Encounter: Payer: Self-pay | Admitting: Family Medicine

## 2024-02-21 ENCOUNTER — Other Ambulatory Visit (HOSPITAL_COMMUNITY)
Admission: RE | Admit: 2024-02-21 | Discharge: 2024-02-21 | Disposition: A | Discharge status: Home / Self Care | Source: Ambulatory Visit | Attending: Family Medicine | Admitting: Family Medicine

## 2024-02-21 VITALS — BP 119/79 | HR 71 | Ht 68.0 in | Wt 164.0 lb

## 2024-02-21 DIAGNOSIS — E1059 Type 1 diabetes mellitus with other circulatory complications: Secondary | ICD-10-CM

## 2024-02-21 DIAGNOSIS — I152 Hypertension secondary to endocrine disorders: Secondary | ICD-10-CM

## 2024-02-21 DIAGNOSIS — Z124 Encounter for screening for malignant neoplasm of cervix: Secondary | ICD-10-CM | POA: Diagnosis not present

## 2024-02-21 DIAGNOSIS — Z01419 Encounter for gynecological examination (general) (routine) without abnormal findings: Secondary | ICD-10-CM | POA: Insufficient documentation

## 2024-02-21 DIAGNOSIS — Z0000 Encounter for general adult medical examination without abnormal findings: Secondary | ICD-10-CM

## 2024-02-21 DIAGNOSIS — Z23 Encounter for immunization: Secondary | ICD-10-CM

## 2024-02-21 DIAGNOSIS — E1069 Type 1 diabetes mellitus with other specified complication: Secondary | ICD-10-CM

## 2024-02-21 DIAGNOSIS — Z0001 Encounter for general adult medical examination with abnormal findings: Secondary | ICD-10-CM | POA: Diagnosis not present

## 2024-02-21 DIAGNOSIS — E785 Hyperlipidemia, unspecified: Secondary | ICD-10-CM

## 2024-02-21 DIAGNOSIS — Z780 Asymptomatic menopausal state: Secondary | ICD-10-CM

## 2024-02-21 DIAGNOSIS — Z1151 Encounter for screening for human papillomavirus (HPV): Secondary | ICD-10-CM | POA: Insufficient documentation

## 2024-02-21 DIAGNOSIS — E039 Hypothyroidism, unspecified: Secondary | ICD-10-CM

## 2024-02-21 DIAGNOSIS — Z13820 Encounter for screening for osteoporosis: Secondary | ICD-10-CM

## 2024-02-21 DIAGNOSIS — F410 Panic disorder [episodic paroxysmal anxiety] without agoraphobia: Secondary | ICD-10-CM

## 2024-02-21 DIAGNOSIS — K52831 Collagenous colitis: Secondary | ICD-10-CM

## 2024-02-21 MED ORDER — ALPRAZOLAM 0.5 MG PO TABS: 0.5000 mg | ORAL_TABLET | ORAL | 5 refills | Status: DC | PRN

## 2024-02-21 NOTE — Progress Notes (Unsigned)
 Complete physical exam   Patient: Kristin [name]   DOB: [birth year]   63 y.o. Female  MRN: [id number] Visit Date: 02/21/2024  Today's healthcare provider: LAURAINE LOISE BUOY, DO   Chief Complaint  Patient presents with   Annual Exam   Subjective    Kristin [name] is a 63 y.o. female who presents today for a complete physical exam.   HPI  Kristin [name] is a 63 year old female who presents for an annual physical exam and Pap smear.  She has a history of collagenous colitis, with symptoms including loose bowel movements. Dietary changes, such as increased fiber intake, may improve her symptoms, while high-fat meals exacerbate them. She recently experienced severe abdominal pain leading to an ER visit, where a blockage was identified and resolved without intervention. She was previously prescribed dicyclomine  for spasms, which was ineffective and has been discontinued.  She experiences occasional postnasal drip, attributed to seasonal changes, but it is not bothersome enough to require regular medication. She occasionally takes Benadryl for itching due to bug bites, which also helps with the postnasal drip.  She takes vitamin D  supplements at a dose of 2000 IU daily. She recalls a past episode of anemia decades ago but has not had recent issues.  She recently retired and is transitioning to [employer]. She has a history of significant weight fluctuations, previously weighing up to 289 pounds, but has since lost weight, particularly after retirement.  She reports occasional postnasal drip, loose stools, and a little nausea/vomiting. Reports occasional postnasal drip and loose stools.   Goes to [facility] for annual eye exams.    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   CARPAL TUNNEL RELEASE Left 11/06/2015   Procedure: CARPAL TUNNEL RELEASE;  Surgeon: Ozell Flake, MD;  Location: [facility] ORS;  Service: Orthopedics;  Laterality: Left;   COLONOSCOPY WITH PROPOFOL  N/A 07/14/2017   Procedure: COLONOSCOPY WITH PROPOFOL ;  Surgeon: Unk Corinn Skiff, MD;  Location: [facility] ENDOSCOPY;  Service: Gastroenterology;  Laterality: N/A;   COLONOSCOPY WITH PROPOFOL  N/A 07/23/2022   Procedure: COLONOSCOPY WITH PROPOFOL ;  Surgeon: Unk Corinn Skiff, MD;  Location: [facility] ENDOSCOPY;  Service: Gastroenterology;  Laterality: N/A;   ctr Right    HAND SURGERY Right 2010   carpal tunnel   THYROID  LOBECTOMY Left 03/2000   Social History   Socioeconomic History   Marital status: Widowed    Spouse name: Not on file   Number of children: 0   Years of education: College   Highest education level: Associate degree: occupational, Scientist, product/process development, or vocational program  Occupational History   Not on file  Tobacco Use   Smoking status: Former    Current packs/day: 0.00    Average packs/day: 1 pack/day for 33.0 years (33.0 ttl pk-yrs)    Types: Cigarettes    Start date: 05/25/1975    Quit date: 05/24/2008    Years since quitting: 15.7   Smokeless tobacco: Never  Vaping Use   Vaping status: Never Used  Substance and Sexual Activity   Alcohol use: Yes    Comment: Occasional alcohol use  Drug use: No   Sexual activity: Not on file  Other Topics Concern   Not on file  Social History Narrative   Not on file   Social Drivers of Health   Financial Resource Strain: Low Risk  (02/17/2024)   Overall Financial Resource Strain (CARDIA)    Difficulty of Paying Living Expenses: Not very hard  Food Insecurity: No Food Insecurity (02/17/2024)   Hunger Vital Sign    Worried About Running Out of Food in the Last Year: Never true    Ran Out of Food in the Last Year: Never true  Transportation  Needs: No Transportation Needs (02/17/2024)   PRAPARE - Administrator, Civil Service (Medical): No    Lack of Transportation (Non-Medical): No  Physical Activity: Inactive (02/17/2024)   Exercise Vital Sign    Days of Exercise per Week: 0 days    Minutes of Exercise per Session: Not on file  Stress: Stress Concern Present (02/17/2024)   [employer] of Occupational Health - Occupational Stress Questionnaire    Feeling of Stress: To some extent  Social Connections: Socially Isolated (02/17/2024)   Social Connection and Isolation Panel    Frequency of Communication with Friends and Family: More than three times a week    Frequency of Social Gatherings with Friends and Family: More than three times a week    Attends Religious Services: Patient declined    Database administrator or Organizations: No    Attends Banker Meetings: Not on file    Marital Status: Widowed  Catering manager Violence: Not on file   Family Status  Relation Name Status   Mother  Alive   Sister  Alive   Brother  Deceased       MVA   MGM  Deceased   MGF  Deceased   Brother  Alive   Father Unknown (Not Specified)   Neg Hx  (Not Specified)  No partnership data on file   Family History  Problem Relation Age of Onset   Alcohol abuse Mother    Anxiety disorder Mother    Hypothyroidism Mother    Alcohol abuse Sister    Anxiety disorder Sister    Bipolar disorder Sister    Alcohol abuse Brother    Depression Maternal Grandmother    Stroke Maternal Grandmother    Alcohol abuse Maternal Grandfather    Heart disease Maternal Grandfather    Healthy Brother    Breast cancer Neg Hx    Allergies  Allergen Reactions   Asacol   [Mesalamine ]     Other reaction(s): Abdominal pain   Lisinopril Cough    Patient Care Team: Rommel Hogston, Lauraine SAILOR, DO as PCP - General (Family Medicine) Carolee Manus DASEN., MD (Ophthalmology)   Medications: Outpatient Medications Prior to Visit  Medication Sig    aspirin 81 MG tablet Take 81 mg by mouth daily.   atorvastatin  (LIPITOR) 40 MG tablet Take 1 tablet (40 mg total) by mouth daily.   Cholecalciferol (VITAMIN D3) 2000 units TABS Take 2 capsules by mouth daily.   HUMALOG KWIKPEN 100 UNIT/ML KwikPen Inject into the skin.   insulin  glargine (LANTUS) 100 UNIT/ML Solostar Pen Inject 22 Units into the skin daily at 10 pm.   levothyroxine (SYNTHROID) 137 MCG tablet Take 137 mcg by mouth daily before breakfast.   losartan  (COZAAR ) 50 MG tablet Take 1 tablet (50 mg total) by mouth daily.   metoprolol  succinate (TOPROL -XL) 25 MG 24 hr tablet Take 1 tablet (  25 mg total) by mouth daily.   OZEMPIC, 1 MG/DOSE, 4 MG/3ML SOPN Inject into the skin.   sertraline  (ZOLOFT ) 100 MG tablet Take 1 tablet (100 mg total) by mouth daily.   budesonide  (ENTOCORT EC ) 3 MG 24 hr capsule Take 1 capsule (3 mg total) by mouth daily.   [DISCONTINUED] ALPRAZolam  (XANAX ) 0.5 MG tablet Take 1 tablet (0.5 mg total) by mouth as needed for anxiety (as needed).   [DISCONTINUED] dicyclomine  (BENTYL ) 10 MG capsule Take 1 capsule (10 mg total) by mouth 3 (three) times daily as needed for spasms. (Patient not taking: Reported on 02/21/2024)   No facility-administered medications prior to visit.    Review of Systems  Constitutional:  Negative for chills, fatigue and fever.  HENT:  Positive for postnasal drip (mild). Negative for congestion, ear pain, rhinorrhea, sneezing and sore throat.   Eyes: Negative.  Negative for pain and redness.  Respiratory:  Negative for cough, shortness of breath and wheezing.   Cardiovascular:  Negative for chest pain, palpitations and leg swelling.  Gastrointestinal:  Negative for abdominal pain, blood in stool, constipation, diarrhea, nausea and vomiting.       +loose stools, at baseline  Endocrine: Negative for polydipsia and polyphagia.  Genitourinary: Negative.  Negative for dysuria, flank pain, hematuria, pelvic pain, vaginal bleeding and vaginal  discharge.  Musculoskeletal:  Negative for arthralgias, back pain, gait problem and joint swelling.  Skin:  Negative for rash.  Neurological: Negative.  Negative for dizziness, tremors, seizures, weakness, light-headedness, numbness and headaches.  Hematological:  Negative for adenopathy.  Psychiatric/Behavioral: Negative.  Negative for behavioral problems, confusion and dysphoric mood. The patient is not nervous/anxious and is not hyperactive.       Objective    BP 119/79 (BP Location: Right Arm, Patient Position: Sitting, Cuff Size: Normal)   Pulse 71   Ht 5' 8 (1.727 m)   Wt 164 lb (74.4 kg)   SpO2 99%   BMI 24.94 kg/m    Physical Exam Vitals and nursing note reviewed.  Constitutional:      General: She is awake.     Appearance: Normal appearance.  HENT:     Head: Normocephalic and atraumatic.     Right Ear: Tympanic membrane, ear canal and external ear normal.     Left Ear: Tympanic membrane, ear canal and external ear normal.     Nose: Nose normal.     Mouth/Throat:     Mouth: Mucous membranes are moist.     Pharynx: Oropharynx is clear. No oropharyngeal exudate or posterior oropharyngeal erythema.  Eyes:     General: No scleral icterus.    Extraocular Movements: Extraocular movements intact.     Conjunctiva/sclera: Conjunctivae normal.     Pupils: Pupils are equal, round, and reactive to light.  Neck:     Thyroid : No thyromegaly or thyroid  tenderness.  Cardiovascular:     Rate and Rhythm: Normal rate and regular rhythm.     Pulses: Normal pulses.     Heart sounds: Normal heart sounds.  Pulmonary:     Effort: Pulmonary effort is normal. No tachypnea, bradypnea or respiratory distress.     Breath sounds: Normal breath sounds. No stridor. No wheezing, rhonchi or rales.  Abdominal:     General: Bowel sounds are normal. There is no distension.     Palpations: Abdomen is soft. There is no mass.     Tenderness: There is no abdominal tenderness. There is no guarding.      Hernia:  No hernia is present.  Genitourinary:    General: Normal vulva.     Exam position: Lithotomy position.     Pubic Area: No rash.      Labia:        Right: No rash, tenderness, lesion or injury.        Left: No rash, tenderness, lesion or injury.      Vagina: Normal.     Cervix: Friability present. No cervical motion tenderness, discharge, lesion, erythema, cervical bleeding or eversion.     Uterus: Normal.      Adnexa: Right adnexa normal and left adnexa normal.  Musculoskeletal:     Cervical back: Normal range of motion and neck supple.     Right lower leg: No edema.     Left lower leg: No edema.  Lymphadenopathy:     Cervical: No cervical adenopathy.  Skin:    General: Skin is warm and dry.  Neurological:     Mental Status: She is alert and oriented to person, place, and time. Mental status is at baseline.  Psychiatric:        Mood and Affect: Mood normal.        Behavior: Behavior normal.      Last depression screening scores    02/21/2024    3:54 PM 08/22/2023    3:41 PM 02/16/2023    8:36 AM  PHQ 2/9 Scores  PHQ - 2 Score 3 2 2   PHQ- 9 Score 5 3 3    Last fall risk screening    02/21/2024    3:54 PM  Fall Risk   Falls in the past year? 0  Number falls in past yr: 0  Injury with Fall? 0   Last Audit-C alcohol use screening    02/17/2024    5:25 PM  Alcohol Use Disorder Test (AUDIT)  1. How often do you have a drink containing alcohol? 3  2. How many drinks containing alcohol do you have on a typical day when you are drinking? 0  3. How often do you have six or more drinks on one occasion? 0  AUDIT-C Score 3   4. How often during the last year have you found that you were not able to stop drinking once you had started? 0  5. How often during the last year have you failed to do what was normally expected from you because of drinking? 0  6. How often during the last year have you needed a first drink in the morning to get yourself going after a heavy  drinking session? 0  7. How often during the last year have you had a feeling of guilt of remorse after drinking? 1  8. How often during the last year have you been unable to remember what happened the night before because you had been drinking? 0  9. Have you or someone else been injured as a result of your drinking? 0  10. Has a relative or friend or a doctor or another health worker been concerned about your drinking or suggested you cut down? 0  Alcohol Use Disorder Identification Test Final Score (AUDIT) 4      Patient-reported   A score of 3 or more in women, and 4 or more in men indicates increased risk for alcohol abuse, EXCEPT if all of the points are from question 1   No results found for any visits on 02/21/24.  Assessment & Plan    Routine Health Maintenance and Physical Exam  Exercise Activities and Dietary recommendations  Goals   None     Immunization History  Administered Date(s) Administered   H1N1 08/01/2008   INFLUENZA, HIGH DOSE SEASONAL PF 05/15/2018, 02/21/2024   Influenza Inj Mdck Quad Pf 03/24/2019   Influenza Split 03/31/2012, 04/14/2015   Influenza, Seasonal, Injecte, Preservative Fre 02/16/2023   Influenza,inj,Quad PF,6+ Mos 04/17/2013, 04/26/2014, 05/15/2018, 01/31/2020, 02/02/2021, 02/12/2022   Influenza-Unspecified 04/17/2013, 04/26/2014, 02/19/2016, 02/11/2017, 03/24/2019   MMR 07/13/1989   Moderna Covid-19 Vaccine Bivalent Booster 70yrs & up 04/24/2021   Moderna Sars-Covid-2 Vaccination 08/22/2019, 09/19/2019, 04/01/2020, 09/05/2020   PNEUMOCOCCAL CONJUGATE-20 08/22/2023   Pneumococcal Polysaccharide-23 04/17/2013   Td 02/02/2021   Tdap 01/14/2011   Zoster Recombinant(Shingrix ) 11/13/2018, 01/22/2019   Zoster, Live 05/15/2013    Health Maintenance  Topic Date Due   Medicare Annual Wellness (AWV)  Never done   OPHTHALMOLOGY EXAM  02/05/2022   DEXA SCAN  Never done   Diabetic kidney evaluation - Urine ACR  10/21/2023   Cervical Cancer  Screening (HPV/Pap Cotest)  11/13/2023   HEMOGLOBIN A1C  01/25/2024   COVID-19 Vaccine (6 - 2025-26 season) 02/06/2025 (Originally 01/23/2024)   Mammogram  06/28/2024   Diabetic kidney evaluation - eGFR measurement  08/02/2024   FOOT EXAM  02/20/2025   Colonoscopy  07/22/2025   DTaP/Tdap/Td (3 - Td or Tdap) 02/03/2031   Pneumococcal Vaccine: 50+ Years  Completed   Influenza Vaccine  Completed   Hepatitis C Screening  Completed   HIV Screening  Completed   Zoster Vaccines- Shingrix   Completed   Hepatitis B Vaccines 19-59 Average Risk  Aged Out   HPV VACCINES  Aged Out   Meningococcal B Vaccine  Aged Out   Lung Cancer Screening  Discontinued    Discussed health benefits of physical activity, and encouraged her to engage in regular exercise appropriate for her age and condition.   Annual physical exam  Pap smear for cervical cancer screening -     Cytology - PAP  Type 1 diabetes mellitus with other specified complication (HCC) -     Microalbumin / creatinine urine ratio -     Hemoglobin A1c  Acquired hypothyroidism -     TSH Rfx on Abnormal to Free T4  Collagenous colitis  Hypertension associated with type 1 diabetes mellitus (HCC) -     Comprehensive metabolic panel with GFR  Hyperlipidemia due to type 1 diabetes mellitus (HCC) -     Lipid panel  Need for influenza vaccination -     Flu vaccine HIGH DOSE PF(Fluzone Trivalent)  Encounter for osteoporosis screening in asymptomatic postmenopausal patient -     DG Bone Density; Future  Panic attacks -     ALPRAZolam ; Take 1 tablet (0.5 mg total) by mouth as needed for anxiety (as needed).  Dispense: 30 tablet; Refill: 5     Annual physical exam; pap smear for cervical cancer screening Routine adult wellness visit. Physical exam overall unremarkable except as noted above. Routine lab work ordered as noted.  - Perform physical exam and Pap smear. - Order bone density scan at [facility] and coordinate with  mammogram appointment.  Type 1 diabetes mellitus with other specified complication Foot exam performed. Follows with endocrinology, Dr. Damian; defer to specialist management.  Patient will be stopping by her eye doctor to try to get a copy of her most recent eye exam.  Acquired Hypothyroidism On levothyroxine 137 mcg. Will check TSH today. Otherwise managed by endocrinology, Dr. Damian; defer to specialist management.  Collagenous colitis Intermittent loose stools exacerbated by high-fat meals. Previous abdominal pain resolved, dicyclomine  ineffective for spasms.  Hypertension associated with type 1 diabetes Chronic, stable.  Blood pressure well-controlled today.  Continue metoprolol  succinate 25 mg daily and losartan  50 mg daily.  No changes.  Hyperlipidemia associated with type 1 diabetes mellitus Chronic, stable.  Ordering recheck of lipid panel today.  Continue atorvastatin  40 mg daily.  Postnasal drip Chronic postnasal drip not requiring daily medication. Benadryl used occasionally for itching and alleviates postnasal drip.    Return in about 3 months (around 05/22/2024) for Welcome to Medicare.     I discussed the assessment and treatment plan with the patient  The patient was provided an opportunity to ask questions and all were answered. The patient agreed with the plan and demonstrated an understanding of the instructions.   The patient was advised to call back or seek an in-person evaluation if the symptoms worsen or if the condition fails to improve as anticipated.    LAURAINE LOISE BUOY, DO  [facility] [phone] (phone) [phone] (fax)  [facility]

## 2024-02-22 ENCOUNTER — Telehealth: Payer: Self-pay | Admitting: Family Medicine

## 2024-02-22 DIAGNOSIS — F410 Panic disorder [episodic paroxysmal anxiety] without agoraphobia: Secondary | ICD-10-CM

## 2024-02-22 NOTE — Telephone Encounter (Signed)
 [facility] faxed refill request for the following medications:  ALPRAZolam  (XANAX ) 0.5 MG tablet (please include dosing frequency)    Please advise.

## 2024-02-23 LAB — CYTOLOGY - PAP
Comment: NEGATIVE
Diagnosis: NEGATIVE
High risk HPV: NEGATIVE

## 2024-02-23 MED ORDER — ALPRAZOLAM 0.5 MG PO TABS: 0.5000 mg | ORAL_TABLET | Freq: Every day | ORAL | 5 refills | Status: DC | PRN

## 2024-02-24 ENCOUNTER — Ambulatory Visit: Payer: Self-pay | Admitting: Family Medicine

## 2024-02-26 ENCOUNTER — Encounter: Payer: Self-pay | Admitting: Family Medicine

## 2024-02-28 LAB — MICROALBUMIN / CREATININE URINE RATIO
Creatinine, Urine: 116.4 mg/dL
Microalb/Creat Ratio: 3 mg/g{creat} (ref 0–29)
Microalbumin, Urine: 3.8 ug/mL

## 2024-02-28 LAB — COMPREHENSIVE METABOLIC PANEL WITH GFR
ALT: 52 IU/L — ABNORMAL HIGH (ref 0–32)
AST: 38 IU/L (ref 0–40)
Albumin: 3.9 g/dL (ref 3.9–4.9)
Alkaline Phosphatase: 63 IU/L (ref 49–135)
BUN/Creatinine Ratio: 15 (ref 12–28)
BUN: 12 mg/dL (ref 8–27)
Bilirubin Total: 0.6 mg/dL (ref 0.0–1.2)
CO2: 26 mmol/L (ref 20–29)
Calcium: 9.3 mg/dL (ref 8.7–10.3)
Chloride: 100 mmol/L (ref 96–106)
Creatinine, Ser: 0.78 mg/dL (ref 0.57–1.00)
Globulin, Total: 2 g/dL (ref 1.5–4.5)
Glucose: 191 mg/dL — ABNORMAL HIGH (ref 70–99)
Potassium: 4.2 mmol/L (ref 3.5–5.2)
Sodium: 139 mmol/L (ref 134–144)
Total Protein: 5.9 g/dL — ABNORMAL LOW (ref 6.0–8.5)
eGFR: 84 mL/min/1.73 (ref >59)

## 2024-02-28 LAB — LIPID PANEL
Chol/HDL Ratio: 1.9 ratio (ref 0.0–4.4)
Cholesterol, Total: 153 mg/dL (ref 100–199)
HDL: 80 mg/dL (ref >39)
LDL Chol Calc (NIH): 60 mg/dL (ref 0–99)
Triglycerides: 62 mg/dL (ref 0–149)
VLDL Cholesterol Cal: 13 mg/dL (ref 5–40)

## 2024-02-28 LAB — HEMOGLOBIN A1C
Est. average glucose Bld gHb Est-mCnc: 128 mg/dL
Hgb A1c MFr Bld: 6.1 % — ABNORMAL HIGH (ref 4.8–5.6)

## 2024-02-28 LAB — TSH RFX ON ABNORMAL TO FREE T4: TSH: 1.55 u[IU]/mL (ref 0.450–4.500)

## 2024-03-06 ENCOUNTER — Other Ambulatory Visit: Payer: Self-pay | Admitting: Family Medicine

## 2024-03-06 DIAGNOSIS — Z1231 Encounter for screening mammogram for malignant neoplasm of breast: Secondary | ICD-10-CM

## 2024-03-22 ENCOUNTER — Encounter: Payer: Self-pay | Admitting: Family Medicine

## 2024-03-22 DIAGNOSIS — F410 Panic disorder [episodic paroxysmal anxiety] without agoraphobia: Secondary | ICD-10-CM

## 2024-03-29 ENCOUNTER — Other Ambulatory Visit: Payer: Self-pay | Admitting: Family Medicine

## 2024-03-29 DIAGNOSIS — F410 Panic disorder [episodic paroxysmal anxiety] without agoraphobia: Secondary | ICD-10-CM

## 2024-03-29 NOTE — Telephone Encounter (Unsigned)
 Copied from CRM #[id number]. Topic: Clinical - Medication Refill >> Mar 29, 2024  1:42 PM [facility] wrote: Medication: ALPRAZolam  (XANAX ) 0.5 MG tablet   Has the patient contacted their pharmacy? Yes, stated that prescription [facility] however, her insurance would not pay for it and stated she needed to switch pharmacy to the [facility] in order for them to pay for it. Attempted to get it switched over however, [facility] refused. Patient was advised to get a new refill.  (Agent: If no, request that the patient contact the pharmacy for the refill. If patient does not wish to contact the pharmacy document the reason why and proceed with request.) (Agent: If yes, when and what did the pharmacy advise?)  This is the patient's preferred pharmacy:  [facility] [address] - [place], [place] - [address] [facility] [address] [place] [address] Phone: [phone] Fax: [phone]  Is this the correct pharmacy for this prescription? Yes If no, delete pharmacy and type the correct one.   Has the prescription been filled recently? Yes  Is the patient out of the medication? No, almost.   Has the patient been seen for an appointment in the last year OR does the patient have an upcoming appointment? Yes  Can we respond through MyChart? Yes  Agent: Please be advised that Rx refills may take up to 3 business days. We ask that you follow-up with your pharmacy.

## 2024-03-30 NOTE — Telephone Encounter (Signed)
 Requested medications are due for refill today.  See note - pt needs rx sent to a different pharmacy  Requested medications are on the active medications list.  yes  Last refill. 02/23/2024 #30 5 rf  Future visit scheduled.   yes  Notes to clinic.  Has the patient contacted their pharmacy? Yes, stated that [facility] however, her insurance would not pay for it and stated she needed to switch pharmacy to the [facility] in order for them to pay for it. Attempted to get it switched over however, [facility]. Patient was advised to get a new refill.  (Agent: If no, request that the patient contact the pharmacy for the refill. If patient does not wish to contact the pharmacy document the reason why and proceed with request.) (Agent: If yes, when and what did the pharmacy advise?)   This is the patient's preferred pharmacy:  [facility] [address] - [place], [place] - [address] [facility] [address] [facility] [address] [place] [address] Phone: [phone] Fax: [phone]    Requested Prescriptions  Pending Prescriptions Disp Refills   ALPRAZolam  (XANAX ) 0.5 MG tablet 30 tablet 5    Sig: Take 1 tablet (0.5 mg total) by mouth daily as needed for anxiety (as needed).     Not Delegated - Psychiatry: Anxiolytics/Hypnotics 2 Failed - 03/30/2024  3:32 PM      Failed - This refill cannot be delegated      Failed - Urine Drug Screen completed in last 360 days      Passed - Patient is not pregnant      Passed - Valid encounter within last 6 months    Recent Outpatient Visits           1 month ago Annual physical exam   [facility] [place], Sarah N, DO   7 months ago Right lower quadrant abdominal pain   [facility] [place], Lauraine SAILOR, DO

## 2024-04-04 ENCOUNTER — Other Ambulatory Visit: Payer: Self-pay | Admitting: Family Medicine

## 2024-04-04 DIAGNOSIS — F410 Panic disorder [episodic paroxysmal anxiety] without agoraphobia: Secondary | ICD-10-CM

## 2024-04-04 NOTE — Telephone Encounter (Signed)
 Requested medications are due for refill today.  See note - pt needs rx sent to a different pharmacy   Requested medications are on the active medications list.  yes   Last refill. 02/23/2024 #30 5 rf   Future visit scheduled.   yes   Notes to clinic.  Has the patient contacted their pharmacy? Yes, stated that [facility] however, her insurance would not pay for it and stated she needed to switch pharmacy to the [facility] in order for them to pay for it. Attempted to get it switched over however, [facility]. Patient was advised to get a new refill.  (Agent: If no, request that the patient contact the pharmacy for the refill. If patient does not wish to contact the pharmacy document the reason why and proceed with request.) (Agent: If yes, when and what did the pharmacy advise?)   This is the patient's preferred pharmacy:  [facility] [address] - [place], [place] - [address] [facility] [address] [facility] [address] [place] [address] Phone: [phone] Fax: [phone]  Patient still awaiting response  Requested Prescriptions  Pending Prescriptions Disp Refills   ALPRAZolam  (XANAX ) 0.5 MG tablet 30 tablet 5    Sig: Take 1 tablet (0.5 mg total) by mouth daily as needed for anxiety (as needed).     Not Delegated - Psychiatry: Anxiolytics/Hypnotics 2 Failed - 04/04/2024  1:55 PM      Failed - This refill cannot be delegated      Failed - Urine Drug Screen completed in last 360 days      Passed - Patient is not pregnant      Passed - Valid encounter within last 6 months    Recent Outpatient Visits           1 month ago Annual physical exam   [facility] [place], Sarah N, DO   7 months ago Right lower quadrant abdominal pain   [place] [facility] [place], Lauraine SAILOR, DO                 Requested Prescriptions  Pending Prescriptions Disp Refills   ALPRAZolam  (XANAX ) 0.5  MG tablet 30 tablet 5    Sig: Take 1 tablet (0.5 mg total) by mouth daily as needed for anxiety (as needed).     Not Delegated - Psychiatry: Anxiolytics/Hypnotics 2 Failed - 04/04/2024  1:55 PM      Failed - This refill cannot be delegated      Failed - Urine Drug Screen completed in last 360 days      Passed - Patient is not pregnant      Passed - Valid encounter within last 6 months    Recent Outpatient Visits           1 month ago Annual physical exam   [facility] [place], Sarah N, DO   7 months ago Right lower quadrant abdominal pain   [facility] [place], Lauraine SAILOR, DO

## 2024-04-04 NOTE — Telephone Encounter (Signed)
 Copied from CRM #[id number]. Topic: Clinical - Medication Refill >> Mar 29, 2024  1:42 PM [facility] wrote: Medication: ALPRAZolam  (XANAX ) 0.5 MG tablet   Has the patient contacted their pharmacy? Yes, stated that prescription was sent to the [facility] however, her insurance would not pay for it and stated she needed to switch pharmacy to the [facility] in order for them to pay for it. Attempted to get it switched over however, [facility] refused. Patient was advised to get a new refill.  (Agent: If no, request that the patient contact the pharmacy for the refill. If patient does not wish to contact the pharmacy document the reason why and proceed with request.) (Agent: If yes, when and what did the pharmacy advise?)  This is the patient's preferred pharmacy:  [facility] [address] - [place], [place] - [address] [facility] [address] [facility] [address] [facility] [address] [place] [address] Phone: [phone] Fax: [phone]  Is this the correct pharmacy for this prescription? Yes If no, delete pharmacy and type the correct one.   Has the prescription been filled recently? Yes  Is the patient out of the medication? No, almost.   Has the patient been seen for an appointment in the last year OR does the patient have an upcoming appointment? Yes  Can we respond through MyChart? Yes  Agent: Please be advised that Rx refills may take up to 3 business days. We ask that you follow-up with your pharmacy.

## 2024-04-05 MED ORDER — ALPRAZOLAM 0.5 MG PO TABS: 0.5000 mg | ORAL_TABLET | Freq: Every day | ORAL | 5 refills | Status: AC | PRN

## 2024-04-10 ENCOUNTER — Ambulatory Visit
Admission: RE | Admit: 2024-04-10 | Discharge: 2024-04-10 | Disposition: A | Discharge status: Home / Self Care | Source: Ambulatory Visit | Attending: Family Medicine | Admitting: Family Medicine

## 2024-04-10 ENCOUNTER — Ambulatory Visit
Admission: RE | Admit: 2024-04-10 | Discharge: 2024-04-10 | Disposition: A | Source: Ambulatory Visit | Attending: Family Medicine | Admitting: Family Medicine

## 2024-04-10 DIAGNOSIS — Z780 Asymptomatic menopausal state: Secondary | ICD-10-CM | POA: Insufficient documentation

## 2024-04-10 DIAGNOSIS — Z13820 Encounter for screening for osteoporosis: Secondary | ICD-10-CM | POA: Diagnosis present

## 2024-04-10 DIAGNOSIS — Z1231 Encounter for screening mammogram for malignant neoplasm of breast: Secondary | ICD-10-CM | POA: Diagnosis present

## 2024-04-23 ENCOUNTER — Ambulatory Visit: Payer: Self-pay | Admitting: Family Medicine

## 2024-05-22 ENCOUNTER — Encounter: Payer: Self-pay | Admitting: Family Medicine

## 2024-05-22 ENCOUNTER — Ambulatory Visit: Admitting: Family Medicine

## 2024-05-22 VITALS — BP 133/65 | HR 59 | Temp 98.4°F | Resp 16 | Wt 168.6 lb

## 2024-05-22 DIAGNOSIS — I152 Hypertension secondary to endocrine disorders: Secondary | ICD-10-CM

## 2024-05-22 DIAGNOSIS — Z0001 Encounter for general adult medical examination with abnormal findings: Secondary | ICD-10-CM | POA: Diagnosis not present

## 2024-05-22 DIAGNOSIS — E1059 Type 1 diabetes mellitus with other circulatory complications: Secondary | ICD-10-CM

## 2024-05-22 DIAGNOSIS — Z0000 Encounter for general adult medical examination without abnormal findings: Secondary | ICD-10-CM | POA: Diagnosis not present

## 2024-05-22 NOTE — Progress Notes (Signed)
 "  Chief Complaint  Patient presents with   Welcome to Medicare Wellness     Subjective:   Kristin [name] is a 63 y.o. female who presents for a Welcome to Medicare Exam.   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) Persons participating in visit and providing information:: patient Medicare Wellness Visit Mode:: In-person (required for WTM) Interpreter Needed?: No Pre-visit prep was completed: no AWV questionnaire completed by patient prior to visit?: no Living arrangements:: (!) lives alone Patient's Overall Health Status Rating: good Typical amount of pain: none Does pain affect daily life?: no Are you currently prescribed opioids?: no  Dietary Habits and Nutritional Risks How many meals a day?: 2 (2-3) Eats fruit and vegetables daily?: yes Most meals are obtained by: preparing own meals In the last 2 weeks, have you had any of the following?: (!) nausea, vomiting, diarrhea (diarrhea-flare) Diabetic:: (!) yes Any non-healing wounds?: no How often do you check your BS?: continuous glucose monitor Would you like to be referred to a Nutritionist or for Diabetic Management? : no  Functional Status Activities of Daily Living (to include ambulation/medication): Independent Ambulation: Independent Medication Administration: Independent Home Management (perform basic housework or laundry): Independent Manage your own finances?: yes Primary transportation is: driving Concerns about vision?: no *vision screening is required for WTM* (patient wears galesses and is followed by eye doctor) Concerns about hearing?: no  Fall Screening Falls in the past year?: 0 Number of falls in past year: 0 Was there an injury with Fall?: 0 Fall Risk Category Calculator: 0 Patient Fall Risk Level: Low Fall Risk  Fall Risk Patient at Risk for Falls Due to: No Fall Risks Fall risk Follow up: Falls evaluation completed  Home and Transportation  Safety: All rugs have non-skid backing?: yes All stairs or steps have railings?: N/A, no stairs Grab bars in the bathtub or shower?: (!) no Have non-skid surface in bathtub or shower?: yes Good home lighting?: yes Regular seat belt use?: yes Hospital stays in the last year:: no  Cognitive Assessment Difficulty concentrating, remembering, or making decisions? : no Will 6CIT or Mini Cog be Completed: no 6CIT or Mini Cog Declined: patient alert, oriented, able to answer questions appropriately and recall recent events  Advance Directives (For Healthcare) Does Patient Have a Medical Advance Directive?: No Would patient like information on creating a medical advance directive?: Yes (MAU/Ambulatory/Procedural Areas - Information given)  Reviewed/Updated  Reviewed/Updated: Reviewed All (Medical, Surgical, Family, Medications, Allergies, Care Teams, Patient Goals)    Allergies (verified) Asacol   [mesalamine ] and Lisinopril   Current Medications (verified) Outpatient Encounter Medications as of 05/22/2024  Medication Sig   ALPRAZolam  (XANAX ) 0.5 MG tablet Take 1 tablet (0.5 mg total) by mouth daily as needed for anxiety (as needed).   aspirin 81 MG tablet Take 81 mg by mouth daily.   atorvastatin  (LIPITOR) 40 MG tablet Take 1 tablet (40 mg total) by mouth daily.   Cholecalciferol (VITAMIN D3) 2000 units TABS Take 2 capsules by mouth daily.   Continuous Glucose Sensor (DEXCOM G7 SENSOR) MISC Inject 1 each into the skin.   Continuous Glucose Sensor (DEXCOM G7 SENSOR) MISC    HUMALOG KWIKPEN 100 UNIT/ML KwikPen Inject into the skin.   insulin  glargine (LANTUS) 100 UNIT/ML Solostar Pen Inject 22 Units into the skin daily at 10 pm.   levothyroxine (SYNTHROID) 137 MCG tablet Take 137 mcg by mouth daily before breakfast.   losartan  (COZAAR ) 50 MG tablet Take 1  tablet (50 mg total) by mouth daily.   metoprolol  succinate (TOPROL -XL) 25 MG 24 hr tablet Take 1 tablet (25 mg total) by mouth daily.    OZEMPIC, 1 MG/DOSE, 4 MG/3ML SOPN Inject into the skin.   sertraline  (ZOLOFT ) 100 MG tablet Take 1 tablet (100 mg total) by mouth daily.   terbinafine (LAMISIL) 250 MG tablet Take 250 mg by mouth daily.   No facility-administered encounter medications on file as of 05/22/2024.    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   CARPAL TUNNEL RELEASE Left 11/06/2015   Procedure: CARPAL TUNNEL RELEASE;  Surgeon: Ozell Flake, MD;  Location: [facility] ORS;  Service: Orthopedics;  Laterality: Left;   COLONOSCOPY WITH PROPOFOL  N/A 07/14/2017   Procedure: COLONOSCOPY WITH PROPOFOL ;  Surgeon: Unk Corinn Skiff, MD;  Location[facility] ENDOSCOPY;  Service: Gastroenterology;  Laterality: N/A;   COLONOSCOPY WITH PROPOFOL  N/A 07/23/2022   Procedure: COLONOSCOPY WITH PROPOFOL ;  Surgeon: Unk Corinn Skiff, MD;  Location: [facility] ENDOSCOPY;  Service: Gastroenterology;  Laterality: N/A;   ctr Right    HAND SURGERY Right 2010   carpal tunnel   THYROID  LOBECTOMY Left 03/2000   Family History  Problem Relation Age of Onset   Alcohol abuse Mother    Anxiety disorder Mother    Hypothyroidism Mother    Alcohol abuse Sister    Anxiety disorder Sister    Bipolar disorder Sister    Alcohol abuse Brother    Depression Maternal Grandmother    Stroke Maternal Grandmother    Arthritis Maternal Grandmother    Diabetes Maternal Grandmother    Hypertension Maternal Grandmother    Varicose Veins Maternal Grandmother    Alcohol abuse Maternal Grandfather    Heart disease Maternal Grandfather    Healthy Brother    Alcohol abuse Sister    Anxiety disorder  Sister    Depression Sister    Drug abuse Sister    Breast cancer Neg Hx    Social History   Occupational History   Not on file  Tobacco Use   Smoking status: Former    Current packs/day: 0.00    Average packs/day: 1 pack/day for 33.0 years (33.0 ttl pk-yrs)    Types: Cigarettes    Start date: 05/25/1975    Quit date: 05/24/2008    Years since quitting: 16.0   Smokeless tobacco: Never  Vaping Use   Vaping status: Never Used  Substance and Sexual Activity   Alcohol use: Yes    Comment: Occasional alcohol use   Drug use: No   Sexual activity: Not on file   Tobacco Counseling Counseling given: N/A  SDOH Screenings   Food Insecurity: No Food Insecurity (05/22/2024)  Housing: Low Risk (05/22/2024)  Transportation Needs: No Transportation Needs (05/22/2024)  Utilities: Not At Risk (05/22/2024)  Alcohol Screen: Low Risk (02/17/2024)  Depression (PHQ2-9): Low Risk (05/22/2024)  Financial Resource Strain: Low Risk (02/17/2024)  Physical Activity: Inactive (05/22/2024)  Social Connections: Socially Isolated (05/22/2024)  Stress: Stress Concern Present (05/22/2024)  Tobacco Use: Medium Risk (05/22/2024)  Health Literacy: Adequate Health Literacy (05/22/2024)   See flowsheets for full screening details  Depression Screen PHQ 2 & 9 Depression Scale- Over the past 2 weeks, how often have you been bothered by any of the following problems? Little interest or pleasure in doing things: 1 Feeling down, depressed, or hopeless (PHQ Adolescent also includes...irritable): 1 PHQ-2 Total Score: 2 Trouble falling or staying asleep, or sleeping too much: 0 Feeling tired or having little energy: 0 Poor appetite or overeating (PHQ Adolescent also includes...weight loss): 0 Feeling bad about yourself - or that you are a failure or have let yourself or your family down: 1 Trouble concentrating on things, such as reading the newspaper or watching television (PHQ Adolescent also includes...like  school work): 0 Moving or speaking so slowly that other people could have noticed. Or the opposite - being so fidgety or restless that you have been moving around a lot more than usual: 0 Thoughts that you would be better off dead, or of hurting yourself in some way: 0 PHQ-9 Total Score: 3 If you checked off any problems, how difficult have these problems made it for you to do your work, take care of things at home, or get along with other people?: Somewhat difficult  Depression Treatment Depression Interventions/Treatment : PHQ2-9 Score <4 Follow-up Not Indicated      Goals Addressed             This Visit's Progress    DIET - EAT MORE FRUITS AND VEGETABLES       Exercise 150 min/wk Moderate Activity               Objective:    Today's Vitals   05/22/24 1402  BP: 133/65  Pulse: (!) 59  Resp: 16  Temp: 98.4 F (36.9 C)  TempSrc: Oral  SpO2: 98%  Weight: 168 lb 9.6 oz (76.5 kg)   Body mass index is 25.64 kg/m.   Physical Exam Vitals and nursing note reviewed.  Constitutional:      General: She is awake.     Appearance: Normal appearance.  HENT:     Head: Normocephalic and atraumatic.     Right Ear: Tympanic membrane, ear canal and external ear normal.     Left Ear: Tympanic membrane, ear canal and external ear normal.     Nose: Nose normal.     Mouth/Throat:     Mouth: Mucous membranes are moist.     Pharynx: Oropharynx is clear. No oropharyngeal exudate or posterior oropharyngeal erythema.  Eyes:     General: No scleral icterus.    Extraocular Movements: Extraocular movements intact.     Conjunctiva/sclera: Conjunctivae normal.     Pupils: Pupils are equal, round, and reactive to light.  Neck:     Thyroid : No thyromegaly or thyroid  tenderness.  Cardiovascular:     Rate and Rhythm: Normal rate and regular rhythm.     Pulses: Normal pulses.     Heart sounds: Normal heart sounds.  Pulmonary:     Effort: Pulmonary effort is normal. No tachypnea, bradypnea  or respiratory distress.     Breath sounds: Normal breath sounds. No stridor. No wheezing, rhonchi or rales.  Abdominal:     General: Bowel sounds are normal. There is no distension.     Palpations: Abdomen is soft. There is no mass.     Tenderness: There is no abdominal tenderness. There is no guarding.     Hernia: No hernia is present.  Musculoskeletal:  Cervical back: Normal range of motion and neck supple.     Right lower leg: No edema.     Left lower leg: No edema.  Lymphadenopathy:     Cervical: No cervical adenopathy.  Skin:    General: Skin is warm and dry.  Neurological:     Mental Status: She is alert and oriented to person, place, and time. Mental status is at baseline.  Psychiatric:        Mood and Affect: Mood normal.        Behavior: Behavior normal.       Hearing/Vision screen Vision Screening - Comments:: Eye exam done-Dr.Bell 2025 Immunizations and Health Maintenance Health Maintenance  Topic Date Due   COVID-19 Vaccine (6 - 2025-26 season) 02/06/2025 (Originally 01/23/2024)   OPHTHALMOLOGY EXAM  08/03/2024   HEMOGLOBIN A1C  08/27/2024   FOOT EXAM  02/20/2025   Diabetic kidney evaluation - eGFR measurement  02/26/2025   Diabetic kidney evaluation - Urine ACR  02/26/2025   Medicare Annual Wellness (AWV)  05/22/2025   Colonoscopy  07/22/2025   Mammogram  04/10/2026   Cervical Cancer Screening (HPV/Pap Cotest)  02/20/2029   DTaP/Tdap/Td (3 - Td or Tdap) 02/03/2031   Pneumococcal Vaccine: 50+ Years  Completed   Influenza Vaccine  Completed   Bone Density Scan  Completed   Hepatitis C Screening  Completed   HIV Screening  Completed   Zoster Vaccines- Shingrix   Completed   Hepatitis B Vaccines 19-59 Average Risk  Aged Out   Meningococcal B Vaccine  Aged Out   Lung Cancer Screening  Discontinued    EKG: unchanged from previous tracings, sinus bradycardia     Assessment/Plan:  This is a routine wellness examination for [employer].  Patient Care  Team: Donzella Lauraine SAILOR, DO as PCP - General (Family Medicine) Carolee Manus DASEN., MD (Ophthalmology)  I have personally reviewed and noted the following in the patients chart:   Medical and social history Use of alcohol, tobacco or illicit drugs  Current medications and supplements including opioid prescriptions. Functional ability and status Nutritional status Physical activity Advanced directives List of other physicians Hospitalizations, surgeries, and ER visits in previous 12 months Vitals Screenings to include cognitive, depression, and falls Referrals and appointments Patient has not currently on any opioid medications.  Orders Placed This Encounter  Procedures   EKG 12-Lead   In addition, I have reviewed and discussed with patient certain preventive protocols, quality metrics, and best practice recommendations. A written personalized care plan for preventive services as well as general preventive health recommendations were provided to patient.   Jasmarie Coppock N Kasyn Stouffer, DO   05/22/2024   No follow-ups on file.  "

## 2024-05-22 NOTE — Patient Instructions (Signed)
"   Kristin [name],  Thank you for taking the time for your Medicare Wellness Visit. I appreciate your continued commitment to your health goals. Please review the care plan we discussed, and feel free to reach out if I can assist you further.  Please note that Annual Wellness Visits do not include a physical exam. Some assessments may be limited, especially if the visit was conducted virtually. If needed, we may recommend an in-person follow-up with your provider.  Ongoing Care Seeing your primary care provider every 3 to 6 months helps us  monitor your health and provide consistent, personalized care.    Recommended Screenings:  Health Maintenance  Topic Date Due   COVID-19 Vaccine (6 - 2025-26 season) 02/06/2025*   Eye exam for diabetics  08/03/2024   Hemoglobin A1C  08/27/2024   Complete foot exam   02/20/2025   Yearly kidney function blood test for diabetes  02/26/2025   Yearly kidney health urinalysis for diabetes  02/26/2025   Medicare Annual Wellness Visit  05/22/2025   Colon Cancer Screening  07/22/2025   Breast Cancer Screening  04/10/2026   Pap with HPV screening  02/20/2029   DTaP/Tdap/Td vaccine (3 - Td or Tdap) 02/03/2031   Pneumococcal Vaccine for age over 64  Completed   Flu Shot  Completed   Osteoporosis screening with Bone Density Scan  Completed   Hepatitis C Screening  Completed   HIV Screening  Completed   Zoster (Shingles) Vaccine  Completed   Hepatitis B Vaccine  Aged Out   Meningitis B Vaccine  Aged Out   Screening for Lung Cancer  Discontinued  *Topic was postponed. The date shown is not the original due date.       05/22/2024    1:45 PM  Advanced Directives  Does Patient Have a Medical Advance Directive? No  Would patient like information on creating a medical advance directive? Yes (MAU/Ambulatory/Procedural Areas - Information given)    Vision: Annual vision screenings are recommended for early detection of glaucoma, cataracts, and diabetic  retinopathy. These exams can also reveal signs of chronic conditions such as diabetes and high blood pressure.  Dental: Annual dental screenings help detect early signs of oral cancer, gum disease, and other conditions linked to overall health, including heart disease and diabetes.  Please see the attached documents for additional preventive care recommendations.   "

## 2024-05-30 ENCOUNTER — Encounter: Payer: Self-pay | Admitting: Family Medicine

## 2024-11-20 ENCOUNTER — Encounter: Admitting: Family Medicine

## 2024-11-21 ENCOUNTER — Encounter
